# Patient Record
Sex: Male | Born: 1980
Health system: Southern US, Community
[De-identification: ages and names within clinical notes are randomized; demographics above are authoritative.]

## PROBLEM LIST (undated history)

## (undated) DIAGNOSIS — J45909 Unspecified asthma, uncomplicated: Secondary | ICD-10-CM

---

## 2004-11-14 ENCOUNTER — Emergency Department (HOSPITAL_COMMUNITY): Admission: EM | Admit: 2004-11-14 | Discharge: 2004-11-14 | Payer: Self-pay | Admitting: Emergency Medicine

## 2004-11-16 ENCOUNTER — Emergency Department (HOSPITAL_COMMUNITY): Admission: EM | Admit: 2004-11-16 | Discharge: 2004-11-16 | Payer: Self-pay | Admitting: Emergency Medicine

## 2005-03-29 ENCOUNTER — Emergency Department (HOSPITAL_COMMUNITY): Admission: EM | Admit: 2005-03-29 | Discharge: 2005-03-29 | Payer: Self-pay | Admitting: Emergency Medicine

## 2005-04-01 ENCOUNTER — Emergency Department (HOSPITAL_COMMUNITY): Admission: EM | Admit: 2005-04-01 | Discharge: 2005-04-01 | Payer: Self-pay | Admitting: Emergency Medicine

## 2005-06-01 ENCOUNTER — Encounter: Admission: RE | Admit: 2005-06-01 | Discharge: 2005-06-01 | Payer: Self-pay | Admitting: *Deleted

## 2013-04-24 ENCOUNTER — Encounter (HOSPITAL_COMMUNITY): Payer: Self-pay | Admitting: Emergency Medicine

## 2013-04-24 ENCOUNTER — Emergency Department (HOSPITAL_COMMUNITY)
Admission: EM | Admit: 2013-04-24 | Discharge: 2013-04-24 | Disposition: A | Discharge status: Home / Self Care | Payer: BC Managed Care – PPO | Attending: Emergency Medicine | Admitting: Emergency Medicine

## 2013-04-24 DIAGNOSIS — T25229A Burn of second degree of unspecified foot, initial encounter: Secondary | ICD-10-CM | POA: Insufficient documentation

## 2013-04-24 DIAGNOSIS — Y92009 Unspecified place in unspecified non-institutional (private) residence as the place of occurrence of the external cause: Secondary | ICD-10-CM | POA: Insufficient documentation

## 2013-04-24 DIAGNOSIS — Z79899 Other long term (current) drug therapy: Secondary | ICD-10-CM | POA: Insufficient documentation

## 2013-04-24 DIAGNOSIS — J45909 Unspecified asthma, uncomplicated: Secondary | ICD-10-CM | POA: Insufficient documentation

## 2013-04-24 DIAGNOSIS — Y93G1 Activity, food preparation and clean up: Secondary | ICD-10-CM | POA: Insufficient documentation

## 2013-04-24 DIAGNOSIS — Z888 Allergy status to other drugs, medicaments and biological substances status: Secondary | ICD-10-CM | POA: Insufficient documentation

## 2013-04-24 DIAGNOSIS — Z23 Encounter for immunization: Secondary | ICD-10-CM | POA: Insufficient documentation

## 2013-04-24 DIAGNOSIS — X12XXXA Contact with other hot fluids, initial encounter: Secondary | ICD-10-CM | POA: Insufficient documentation

## 2013-04-24 DIAGNOSIS — IMO0002 Reserved for concepts with insufficient information to code with codable children: Secondary | ICD-10-CM

## 2013-04-24 HISTORY — DX: Unspecified asthma, uncomplicated: J45.909

## 2013-04-24 MED ORDER — TETANUS-DIPHTH-ACELL PERTUSSIS 5-2.5-18.5 LF-MCG/0.5 IM SUSP
0.5000 mL | Freq: Once | INTRAMUSCULAR | Status: AC
  Administered 2013-04-24: 0.5 mL via INTRAMUSCULAR
  Filled 2013-04-24: qty 0.5
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
## 2013-04-28 NOTE — ED Provider Notes (Signed)
Medical screening examination/treatment/procedure(s) were performed by non-physician practitioner and as supervising physician I was immediately available for consultation/collaboration.  EKG Interpretation   None        Ethelda Chick, MD 04/28/13 [phone]

## 2013-06-09 ENCOUNTER — Encounter (HOSPITAL_COMMUNITY): Payer: Self-pay | Admitting: Emergency Medicine

## 2013-06-09 ENCOUNTER — Emergency Department (HOSPITAL_COMMUNITY)
Admission: EM | Admit: 2013-06-09 | Discharge: 2013-06-09 | Disposition: A | Discharge status: Home / Self Care | Payer: BC Managed Care – PPO | Attending: Emergency Medicine | Admitting: Emergency Medicine

## 2013-06-09 ENCOUNTER — Emergency Department (HOSPITAL_COMMUNITY): Payer: BC Managed Care – PPO

## 2013-06-09 DIAGNOSIS — Z79899 Other long term (current) drug therapy: Secondary | ICD-10-CM | POA: Insufficient documentation

## 2013-06-09 DIAGNOSIS — J45909 Unspecified asthma, uncomplicated: Secondary | ICD-10-CM | POA: Insufficient documentation

## 2013-06-09 DIAGNOSIS — J111 Influenza due to unidentified influenza virus with other respiratory manifestations: Secondary | ICD-10-CM

## 2013-06-09 MED ORDER — DIPHENHYDRAMINE HCL 50 MG/ML IJ SOLN
25.0000 mg | Freq: Once | INTRAMUSCULAR | Status: AC
  Administered 2013-06-09: 25 mg via INTRAVENOUS
  Filled 2013-06-09: qty 1

## 2013-06-09 MED ORDER — ACETAMINOPHEN 325 MG PO TABS
650.0000 mg | ORAL_TABLET | Freq: Four times a day (QID) | ORAL | Status: DC | PRN
  Administered 2013-06-09: 650 mg via ORAL
  Filled 2013-06-09: qty 2

## 2013-06-09 MED ORDER — SODIUM CHLORIDE 0.9 % IV BOLUS (SEPSIS)
1000.0000 mL | INTRAVENOUS | Status: AC
  Administered 2013-06-09: 1000 mL via INTRAVENOUS

## 2013-06-09 MED ORDER — OSELTAMIVIR PHOSPHATE 75 MG PO CAPS: 75.0000 mg | ORAL_CAPSULE | Freq: Two times a day (BID) | ORAL | Status: DC

## 2013-06-09 MED ORDER — METOCLOPRAMIDE HCL 5 MG/ML IJ SOLN
5.0000 mg | Freq: Once | INTRAMUSCULAR | Status: AC
  Administered 2013-06-09: 5 mg via INTRAVENOUS
  Filled 2013-06-09: qty 2

## 2013-06-09 MED ORDER — DEXAMETHASONE SODIUM PHOSPHATE 10 MG/ML IJ SOLN
10.0000 mg | Freq: Once | INTRAMUSCULAR | Status: AC
  Administered 2013-06-09: 10 mg via INTRAVENOUS
  Filled 2013-06-09: qty 1

## 2013-06-09 MED ORDER — OSELTAMIVIR PHOSPHATE 75 MG PO CAPS
75.0000 mg | ORAL_CAPSULE | Freq: Once | ORAL | Status: AC
  Administered 2013-06-09: 75 mg via ORAL
  Filled 2013-06-09: qty 1

## 2013-06-09 NOTE — ED Notes (Signed)
Patient transported to X-ray 

## 2013-06-09 NOTE — ED Provider Notes (Signed)
CSN: [id number]     Arrival date & time 06/09/13  0813 History   First MD Initiated Contact with Patient 06/09/13 [phone]     Chief Complaint  Patient presents with  . Influenza   (Consider location/radiation/quality/duration/timing/severity/associated sxs/prior Treatment) Patient is a 33 y.o. male presenting with flu symptoms. The history is provided by the patient.  Influenza Presenting symptoms: cough, fatigue, fever, headache and myalgias   Presenting symptoms: no diarrhea, no nausea, no rhinorrhea, no shortness of breath and no vomiting   Cough:    Cough characteristics:  Non-productive   Severity:  Mild   Onset quality:  Gradual   Duration:  1 day   Progression:  Unchanged Fever:    Duration:  1 day   Timing:  Constant   Max temp PTA (F):  102   Temp source:  Oral Headaches:    Severity:  Mild   Onset quality:  Gradual   Duration:  1 day   Timing:  Constant   Progression:  Unchanged   Chronicity:  New   Past Medical History  Diagnosis Date  . Asthma    History reviewed. No pertinent past surgical history. No family history on file. History  Substance Use Topics  . Smoking status: Never Smoker   . Smokeless tobacco: Not on file  . Alcohol Use: No    Review of Systems  Constitutional: Positive for fever and fatigue.  HENT: Negative for drooling and rhinorrhea.   Eyes: Negative for pain.  Respiratory: Positive for cough. Negative for shortness of breath.   Cardiovascular: Negative for chest pain and leg swelling.  Gastrointestinal: Negative for nausea, vomiting, abdominal pain and diarrhea.  Genitourinary: Negative for dysuria and hematuria.  Musculoskeletal: Positive for myalgias. Negative for gait problem and neck pain.  Skin: Negative for color change.  Neurological: Positive for headaches. Negative for numbness.  Hematological: Negative for adenopathy.  Psychiatric/Behavioral: Negative for behavioral problems.  All other systems reviewed and are  negative.    Allergies  Theophyllines  Home Medications   Current Outpatient Rx  Name  Route  Sig  Dispense  Refill  . albuterol (PROVENTIL HFA;VENTOLIN HFA) 108 (90 BASE) MCG/ACT inhaler   Inhalation   Inhale 2 puffs into the lungs every 6 (six) hours as needed for wheezing or shortness of breath.         . Multiple Vitamin (MULTIVITAMIN WITH MINERALS) TABS tablet   Oral   Take 1 tablet by mouth daily.          BP 137/63  Pulse 94  Temp(Src) 102.1 F (38.9 C) (Oral)  Resp 20  SpO2 96% Physical Exam  Nursing note and vitals reviewed. Constitutional: He is oriented to person, place, and time. He appears well-developed and well-nourished.  HENT:  Head: Normocephalic and atraumatic.  Right Ear: External ear normal.  Left Ear: External ear normal.  Nose: Nose normal.  Mouth/Throat: Oropharynx is clear and moist. No oropharyngeal exudate.  Eyes: Conjunctivae and EOM are normal. Pupils are equal, round, and reactive to light.  Neck: Normal range of motion. Neck supple.  Cardiovascular: Normal rate, regular rhythm, normal heart sounds and intact distal pulses.  Exam reveals no gallop and no friction rub.   No murmur heard. Pulmonary/Chest: Effort normal and breath sounds normal. No respiratory distress. He has no wheezes.  Abdominal: Soft. Bowel sounds are normal. He exhibits no distension. There is no tenderness. There is no rebound and no guarding.  Musculoskeletal: Normal range of motion. He exhibits  no edema and no tenderness.  Neurological: He is alert and oriented to person, place, and time.  Skin: Skin is warm and dry.  Psychiatric: He has a normal mood and affect. His behavior is normal.    ED Course  Procedures (including critical care time) Labs Review Labs Reviewed - No data to display Imaging Review Dg Chest 2 View  06/09/2013   CLINICAL DATA:  Fever  EXAM: CHEST  2 VIEW  COMPARISON:  November 14, 2004  FINDINGS: Lungs are clear. Heart size and pulmonary  vascularity are normal. No adenopathy. No bone lesions.  IMPRESSION: No abnormality noted.   Electronically Signed   By: Bretta BangWilliam  Woodruff M.D.   On: 06/09/2013 09:10    EKG Interpretation   None       MDM   1. Influenza    8:39 AM [age] y.o. male who presents with headache, body aches, fever, chills which began yesterday. He is febrile here but vital signs otherwise unremarkable. I suspect he has a viral syndrome. Will treat headache with a migraine cocktail and get a screening chest x-ray. Suspect viral syndrome.   Pt care transferred to Dr. Rosalia Hammersay.     Junius ArgyleForrest S Madalyne Husk, MD 06/09/13 [phone]

## 2013-06-09 NOTE — ED Provider Notes (Signed)
33 year old male history of asthma presents today with cough and fever. Originally seen by Dr. Romeo AppleHarrison and checked out to me. Patient appears hemodynamically stable. Chest x-[facility] was obtained and there is no evidence of acute infiltrate.  Discussed results with patient and family.  Discussed cost /benefit ratio tamiflu and patient wishes to proceed with treatment.  Plan work note for three days or until afebrile for 24 hours.  Return precautions discussed and patient will be given referral info and advised can recheck ucc or here if worse.    Hilario Quarryanielle S Quetzaly Ebner, MD 06/09/13 [phone]

## 2013-06-09 NOTE — Discharge Instructions (Signed)
Influenza, Adult °Influenza (flu) is an infection in the mouth, nose, and throat (respiratory tract) caused by a virus. The flu can make you feel very ill. Influenza spreads easily from person to person (contagious).  °HOME CARE  °· Only take medicines as told by your doctor. °· Use a cool mist humidifier to make breathing easier. °· Get plenty of rest until your fever goes away. This usually takes 3 to 4 days. °· Drink enough fluids to keep your pee (urine) clear or pale yellow. °· Cover your mouth and nose when you cough or sneeze. °· Wash your hands well to avoid spreading the flu. °· Stay home from work or school until your fever has been gone for at least 1 full day. °· Get a flu shot every year. °GET HELP RIGHT AWAY IF:  °· You have trouble breathing or feel short of breath. °· Your skin or nails turn blue. °· You have severe neck pain or stiffness. °· You have a severe headache, facial pain, or earache. °· Your fever gets worse or keeps coming back. °· You feel sick to your stomach (nauseous), throw up (vomit), or have watery poop (diarrhea). °· You have chest pain. °· You have a deep cough that gets worse, or you cough up more thick spit (mucus). °MAKE SURE YOU:  °· Understand these instructions. °· Will watch your condition. °· Will get help right away if you are not doing well or get worse. °Document Released: 02/12/2008 Document Revised: 11/04/2011 Document Reviewed: 08/04/2011 °ExitCare® Patient Information ©2014 [employer]. ° ° °Emergency Department Resource Guide °1) Find a Doctor and Pay Out of Pocket °Although you won't have to find out who is covered by your insurance plan, it is a good idea to ask around and get recommendations. You will then need to call the office and see if the doctor you have chosen will accept you as a new patient and what types of options they offer for patients who are self-pay. Some doctors offer discounts or will set up payment plans for their patients who do not have  insurance, but you will need to ask so you aren't surprised when you get to your appointment. ° °2) Contact Your Local Health Department °Not all health departments have doctors that can see patients for sick visits, but many do, so it is worth a call to see if yours does. If you don't know where your local health department is, you can check in your phone book. The CDC also has a tool to help you locate your state's health department, and many state websites also have listings of all of their local health departments. ° °3) Find a Walk-in Clinic °If your illness is not likely to be very severe or complicated, you may want to try a walk in clinic. These are popping up all over the country in pharmacies, drugstores, and shopping centers. They're usually staffed by nurse practitioners or physician assistants that have been trained to treat common illnesses and complaints. They're usually fairly quick and inexpensive. However, if you have serious medical issues or chronic medical problems, these are probably not your best option. ° °No Primary Care Doctor: °- Call [facility] at  832-8000 - they can help you locate a primary care doctor that  accepts your insurance, provides certain services, etc. °- Physician Referral Service- 1-800-533-3463 ° °Chronic Pain Problems: °Organization         Address  Phone   Notes  °[facility]  (  336) 297-2271 Patients need to be referred by their primary care doctor.  ° °Medication Assistance: °Organization         Address  Phone   Notes  °[place] [facility] [address] °[place], [place] [address] (336) 641-8030 --Must be a resident of [place] °-- Must have NO insurance coverage whatsoever (no Medicaid/ Medicare, etc.) °-- The pt. MUST have a primary care doctor that directs their care regularly and follows them in the community °  °[facility]  (866) 331-1348   °[employer]  (888) 892-1162   ° °Agencies that provide  inexpensive medical care: °Organization         Address  Phone   Notes  °[facility]  (336) 832-8035   °[facility]    (336) 832-7272   °[facility] [address] °[place], [place] [address] (336) 832-4777   °[facility] [address], °[place] (336) 271-4999   °[facility]    (336) 373-0678   °[facility]    (336) 272-1050   °[facility] ° [address], [place] Phone:  (336) 832-4444, Fax:  (336) 832-4440 Hours of Operation:  9 am - 6 pm, M-F.  Also accepts Medicaid/Medicare and self-pay.  °[facility] ° [address], [place] Phone: (336) 832-3150, Fax: (336) 832-3151. Hours of Operation:  8:30 am - 5:30 pm, M-F.  Also accepts Medicaid and self-pay.  °[facility] [place] [address], [place] Phone: (336) 878-6027   °[facility] [address], [place], [place] (336)723-1848, Ext. 123 Mondays & Thursdays: 7-9 AM.  First 15 patients are seen on a first come, first serve basis. °  ° °Medicaid-accepting [place] Providers: ° °Organization         Address  Phone   Notes  °[facility] [address], [place] (336) 641-2100 Also accepts self-pay patients.  °[facility] [address], [place] ° (336) 856-9996   °[facility] [address], [place] (336) 288-8857   °[facility] [address] [place] [address], [place] (336) 299-7000   °Veita Bland [address], [place]  ° (336) 373-1557 Only accepts [place] Access Medicaid patients after they have their name applied to their card.  ° °Self-Pay (no insurance) in [place]: ° °Organization         Address  Phone   Notes  °Sickle Cell Patients, [facility] [address], [place] (336) 832-1970   °[facility] [address], [place] (336) 832-4400   °[facility] ° [address], [place] (336) 992-4800   °[facility]/Dr. Osei-Bonsu ° [address] [place] [address], [place] or [address], [place] (336) 841-8500 Phone number for both [place] and [place] locations is the same.  °[facility] [address], [place] (336) 299-0000   °[facility] [address] [place] [address], [place] or [address] (336) 852-7530 °(336) 878-2260   °[facility] [address],  (336) 350-1642, phone; (336) 294-5005, fax Sees patients 1st and 3rd Saturday of every month.  Must not qualify for public or private insurance (i.e. Medicaid, Medicare, [place] Health Choice, Veterans' Benefits) •   Household income should be no more than 200% of the poverty level •The clinic cannot treat you if you are pregnant or think you are pregnant • Sexually transmitted diseases are not treated at the clinic.  ° ° °Dental Care: °Organization         Address  Phone  Notes  °[facility] [address], [place] (336) 641-6152 Accepts children up to age 21 who are enrolled in Medicaid or [place] Health Choice; pregnant women with a Medicaid card; and children who have applied for Medicaid or [place] Health Choice, but were declined, whose parents can pay a reduced fee at time of service.  °[facility] [place]  [address], [place] (336) 641-7733 Accepts children up to age 21 who are enrolled in Medicaid or [place] Health Choice; pregnant women with a Medicaid card; and children who have applied for Medicaid or [place] Health Choice, but were declined, whose parents can pay a reduced fee at time of service.  °[facility] ° [address], [place] (336) 641-4533 Patients are seen by appointment only. Walk-ins are  not accepted. [facility] will see patients 18 years of age and older. °Monday - Tuesday (8am-5pm) °Most Wednesdays (8:30-5pm) °$30 per visit, cash only  °[facility] ° [address], [place] (336) 641-4533 Patients are seen by appointment only. Walk-ins are not accepted. [facility] will see patients 18 years of age and older. °One Wednesday Evening (Monthly: Volunteer Based).  $30 per visit, cash only  °[facility]  (919) 537-3737 for adults; Children under age 4, call Graduate Pediatric Dentistry at (919) [phone]. Children aged 4-14, please call (919) [phone] to request a pediatric application. ° Dental services are provided in all areas of dental care including fillings, crowns and bridges, complete and partial dentures, implants, gum treatment, root canals, and extractions. Preventive care is also provided. Treatment is provided to both adults and children. °Patients are selected via a lottery and there is often a waiting list. °  °[facility] [address], °[place] ° (336) 763-8833 [email or web address] °  °[facility] [address], [place], [place] (336)723-1848, Ext. 123 Second and Fourth Thursday of each month, opens at 6:30 AM; Clinic ends at 9 AM.  Patients are seen on a first-come first-served basis, and a limited number are seen during each clinic.  ° °[facility] ° [address], [place], [place] (336) 723-7904   Eligibility Requirements °You must have lived in [place], [place], or [place] counties for at least the last three months. °  You cannot be eligible for state or federal sponsored healthcare insurance, including Veterans Administration, Medicaid, or Medicare. °  You generally cannot be eligible for healthcare insurance through your employer.  °  How to apply: °Eligibility screenings are held every Tuesday and Wednesday afternoon from 1:00 pm until 4:00 pm. You do not need an appointment for  the interview!  °[facility] [address], [place],  336-631-2330   °[facility]  336-342-8273   °[facility]  336-703-3100   °[facility]  336-570-6415   ° °Behavioral Health Resources in the Community: °Intensive Outpatient Programs °Organization         Address  Phone  Notes  °[facility] [address], [place],   [place] 336-878-6098   °[facility] [address], [place], [place] 336-832-9800   °[facility] [address], [place], [place] ° 336-882-2125   °[facility] [address],  °[place], [place] 1-800-853-5163 or 336-641-4981   °Substance Abuse Resources °Organization         Address  Phone  Notes  °[facility]  336-882-2125   °[facility]  336-784-9470   °[facility]  336-285-9073   °[facility]  336-845-3988   °Residential & Outpatient Substance Abuse Program  1-800-659-3381   °Psychological Services °Organization         Address  Phone  Notes  °[facility]  336- 832-9600   °[facility]  336- 378-7881   °[facility] [address], [place] 1-800-853-5163 or 336-641-4981   ° °Mobile Crisis Teams °Organization         Address  Phone  Notes  °[facility], Mobile Crisis Care Unit  1-877-626-1772   °Assertive °[facility] ° [address] [place], [place] 336-834-9664   °Sharon DeEsch [address] °[place] 336-554-5454   ° °Self-Help/Support Groups °Organization         Address  Phone             Notes  °[facility] [place] [facility] - variety of support groups  336- 373-1402 Call for more information  °Narcotics Anonymous (NA), [facility] [address], °[place]  2 meetings at this location  ° °Residential Treatment Programs °Organization         Address  Phone  Notes  °[facility]  [address],    °[place]  1-866-801-8205   °[facility] ° [address], [place], [place] 704-293-8524   °[facility] [address], [place] 336-845-3988 Admissions: 8am-3pm M-F  °[facility] [address]    °[place], [place] 336-841-1104   °[facility] [address], [place], [place] 336-379-7146   °[facility] [address]  °[place], [place] 336-285-9073   °[facility] - Intensive Outpatient [address], , [place] 336-852-3033   °[facility] [address]  °[place], [place] 1-877-615-2722 or 336-784-9470   °[facility] [address] [place], [place] 336-227-7417 Accepts Medicaid  °[facility] [address]  ° [place] 1-800-659-3381 Substance Abuse/Addiction Treatment  ° °[place] Behavioral Health Resources °Organization         Address  Phone  Notes  °[facility]  (888) 581-9988   °Julie Brannon, PhD [address] [place], [place]   (336) 349-5553 or (336) 951-0000   °[facility]   [address] °[place], [place] (336) 349-4454   °[facility] [address], [place], [place] (336) 342-8316 Insurance/Medicaid/sponsorship through [facility]  °[facility] [address]                                    [place],  (336) 342-8316 Therapy/tele-psych/case  °[facility] [address]  ° [place],  (336) 349-2233    °Dr. Arfeen  (336) 349-4544   °[facility] [place]  [employer] [place] [facility] 1) [address], [place] °2) [address], [place] °3)  [address], [place] (336) 349-3220 °(336) 342-7768 ° °(336) 342-8140   °[place] [facility] (336) 342-1394 or (336) 342-3537 (After Hours)    ° ° ° °

## 2013-06-09 NOTE — ED Notes (Signed)
MD at bedside. 

## 2013-06-09 NOTE — ED Notes (Signed)
Pt reports headache, body aches, SOB, tiredness, and chills. Pt denies taking any OTC medications today.

## 2013-08-05 ENCOUNTER — Encounter (HOSPITAL_COMMUNITY): Payer: Self-pay | Admitting: Emergency Medicine

## 2013-08-05 ENCOUNTER — Emergency Department (HOSPITAL_COMMUNITY)
Admission: EM | Admit: 2013-08-05 | Discharge: 2013-08-06 | Disposition: A | Discharge status: Home / Self Care | Payer: Self-pay | Attending: Emergency Medicine | Admitting: Emergency Medicine

## 2013-08-05 DIAGNOSIS — L0390 Cellulitis, unspecified: Secondary | ICD-10-CM

## 2013-08-05 DIAGNOSIS — L02419 Cutaneous abscess of limb, unspecified: Secondary | ICD-10-CM | POA: Insufficient documentation

## 2013-08-05 DIAGNOSIS — J45909 Unspecified asthma, uncomplicated: Secondary | ICD-10-CM | POA: Insufficient documentation

## 2013-08-05 DIAGNOSIS — L03119 Cellulitis of unspecified part of limb: Principal | ICD-10-CM

## 2013-08-05 DIAGNOSIS — Z79899 Other long term (current) drug therapy: Secondary | ICD-10-CM | POA: Insufficient documentation

## 2013-08-05 LAB — CBC WITH DIFFERENTIAL/PLATELET
Basophils Absolute: 0.1 10*3/uL (ref 0.0–0.1)
Basophils Relative: 1 % (ref 0–1)
Eosinophils Absolute: 0.2 10*3/uL (ref 0.0–0.7)
Eosinophils Relative: 2 % (ref 0–5)
HEMATOCRIT: 38 % — AB (ref 39.0–52.0)
Hemoglobin: 13.9 g/dL (ref 13.0–17.0)
LYMPHS PCT: 33 % (ref 12–46)
Lymphs Abs: 3.6 10*3/uL (ref 0.7–4.0)
MCH: 30.4 pg (ref 26.0–34.0)
MCHC: 36.6 g/dL — ABNORMAL HIGH (ref 30.0–36.0)
MCV: 83.2 fL (ref 78.0–100.0)
MONO ABS: 1.6 10*3/uL — AB (ref 0.1–1.0)
Monocytes Relative: 15 % — ABNORMAL HIGH (ref 3–12)
NEUTROS PCT: 51 % (ref 43–77)
Neutro Abs: 5.5 10*3/uL (ref 1.7–7.7)
Platelets: 175 10*3/uL (ref 150–400)
RBC: 4.57 MIL/uL (ref 4.22–5.81)
RDW: 13.8 % (ref 11.5–15.5)
WBC: 10.9 10*3/uL — ABNORMAL HIGH (ref 4.0–10.5)

## 2013-08-05 LAB — I-STAT CG4 LACTIC ACID, ED: Lactic Acid, Venous: 1.02 mmol/L (ref 0.5–2.2)

## 2013-08-05 MED ORDER — KETOROLAC TROMETHAMINE 30 MG/ML IJ SOLN
30.0000 mg | Freq: Once | INTRAMUSCULAR | Status: AC
  Administered 2013-08-05: 30 mg via INTRAVENOUS
  Filled 2013-08-05: qty 1

## 2013-08-05 MED ORDER — CLINDAMYCIN PHOSPHATE 600 MG/50ML IV SOLN
600.0000 mg | Freq: Once | INTRAVENOUS | Status: AC
  Administered 2013-08-05: 600 mg via INTRAVENOUS
  Filled 2013-08-05: qty 50

## 2013-08-05 MED ORDER — SODIUM CHLORIDE 0.9 % IV BOLUS (SEPSIS)
1000.0000 mL | Freq: Once | INTRAVENOUS | Status: AC
  Administered 2013-08-05: 1000 mL via INTRAVENOUS

## 2013-08-05 NOTE — ED Notes (Signed)
The pt has a red swollen lesion to the lt lower leg for 2 days.  He thinks it was a spider bite but he did not see a spider

## 2013-08-05 NOTE — ED Provider Notes (Signed)
CSN: [id number]     Arrival date & time 08/05/13  2202 History  This chart was scribed for non-physician practitioner, Junious Silk, PA-C,working with Shon Baton, MD, by Karle Plumber, ED Scribe.  This patient was seen in room TR11C/TR11C and the patient's care was started at 10:47 PM.  Chief Complaint  Patient presents with  . Insect Bite   The history is provided by the patient. No language interpreter was used.   HPI Comments:  Luis Harvey is a 33 y.o. male with history of asthma who presents to the Emergency Department complaining of a worsening area of swelling, itching, and redness on his right shin that started two days ago. He states the pain started as a pinch and he believes he may have been bitten by a bug, although he did not see one. He reports the pain as throbbing and is a 8/10. He used an OTC spray with aloe in it yesterday with little relief of his symptoms. He denies nausea, vomiting, fever, chills, or abdominal pain.   Past Medical History  Diagnosis Date  . Asthma    History reviewed. No pertinent past surgical history. No family history on file. History  Substance Use Topics  . Smoking status: Never Smoker   . Smokeless tobacco: Not on file  . Alcohol Use: No    Review of Systems  Constitutional: Negative for fever.  Gastrointestinal: Negative for nausea, vomiting and abdominal pain.  Skin: Positive for color change.  All other systems reviewed and are negative.    Allergies  Theophyllines  Home Medications   Current Outpatient Rx  Name  Route  Sig  Dispense  Refill  . albuterol (PROVENTIL HFA;VENTOLIN HFA) 108 (90 BASE) MCG/ACT inhaler   Inhalation   Inhale 2 puffs into the lungs every 6 (six) hours as needed for wheezing or shortness of breath.         . Multiple Vitamin (MULTIVITAMIN WITH MINERALS) TABS tablet   Oral   Take 1 tablet by mouth daily.         Marland Kitchen oseltamivir (TAMIFLU) 75 MG capsule   Oral   Take 1 capsule (75  mg total) by mouth every 12 (twelve) hours.   10 capsule   0    Triage Vitals: BP 140/77  Pulse 82  Temp(Src) 99.3 F (37.4 C) (Oral)  Resp 18  Ht 5\' 11"  (1.803 m)  Wt 199 lb 4 oz (90.379 kg)  BMI 27.80 kg/m2  SpO2 96% Physical Exam  Nursing note and vitals reviewed. Constitutional: He is oriented to person, place, and time. He appears well-developed and well-nourished. No distress.  HENT:  Head: Normocephalic and atraumatic.  Right Ear: External ear normal.  Left Ear: External ear normal.  Nose: Nose normal.  Eyes: Conjunctivae are normal.  Neck: Normal range of motion. No tracheal deviation present.  Cardiovascular: Normal rate, regular rhythm, normal heart sounds, intact distal pulses and normal pulses.   Pulses:      Posterior tibial pulses are 2+ on the right side, and 2+ on the left side.  Pulmonary/Chest: Effort normal and breath sounds normal. No stridor.  Abdominal: Soft. He exhibits no distension. There is no tenderness.  Musculoskeletal: Normal range of motion.  Neurological: He is alert and oriented to person, place, and time.  Skin: Skin is warm and dry. He is not diaphoretic. There is erythema.  4 cm central area of induration without fluctuance with surrounding erythema and warmth. Left leg swollen in comparison  to right.   Psychiatric: He has a normal mood and affect. His behavior is normal.    ED Course  Procedures (including critical care time) DIAGNOSTIC STUDIES: Oxygen Saturation is 96% on RA, adequate by my interpretation.   COORDINATION OF CARE: 10:51 PM- Will order lab work and administer IV antibiotics. Pt verbalizes understanding and agrees to plan.  Medications  clindamycin (CLEOCIN) IVPB 600 mg (0 mg Intravenous Stopped 08/06/13 0112)  ketorolac (TORADOL) 30 MG/ML injection 30 mg (30 mg Intravenous Given 08/05/13 2317)  sodium chloride 0.9 % bolus 1,000 mL (0 mLs Intravenous Stopped 08/06/13 0044)  gi cocktail (Maalox,Lidocaine,Donnatal) (30 mLs  Oral Given 08/06/13 0047)    Labs Review Labs Reviewed  CBC WITH DIFFERENTIAL - Abnormal; Notable for the following:    WBC 10.9 (*)    HCT 38.0 (*)    MCHC 36.6 (*)    Monocytes Relative 15 (*)    Monocytes Absolute 1.6 (*)    All other components within normal limits  COMPREHENSIVE METABOLIC PANEL - Abnormal; Notable for the following:    GFR calc non Af Amer 87 (*)    All other components within normal limits  CULTURE, BLOOD (ROUTINE X 2)  CULTURE, BLOOD (ROUTINE X 2)  I-STAT CG4 LACTIC ACID, ED   Imaging Review No results found.   EKG Interpretation None      MDM   Final diagnoses:  Cellulitis    Patient presents to ED with cellulitis. He is well appearing, non toxic. Labs were done which shows elevated WBC at 10.9. IV clindamycin done in ED. Area of erythema was circled with a skin marker. Patient is hemodynamically stable and appropriate for discharge with PO abx. Gave strict return instructions. Patient to follow up with PCP for recheck on Monday. Discussed case with Dr. Wilkie AyeHorton. Vital signs stable for discharge. Patient / Family / Caregiver informed of clinical course, understand medical decision-making process, and agree with plan.    I personally performed the services described in this documentation, which was scribed in my presence. The recorded information has been reviewed and is accurate.    Mora BellmanHannah S Raaga Maeder, PA-C 08/06/13 [phone]

## 2013-08-06 LAB — COMPREHENSIVE METABOLIC PANEL
ALBUMIN: 3.8 g/dL (ref 3.5–5.2)
ALK PHOS: 55 U/L (ref 39–117)
ALT: 13 U/L (ref 0–53)
AST: 29 U/L (ref 0–37)
BUN: 12 mg/dL (ref 6–23)
CO2: 28 mEq/L (ref 19–32)
Calcium: 9.2 mg/dL (ref 8.4–10.5)
Chloride: 103 mEq/L (ref 96–112)
Creatinine, Ser: 1.09 mg/dL (ref 0.50–1.35)
GFR calc Af Amer: >90 mL/min (ref >90)
GFR calc non Af Amer: 87 mL/min — ABNORMAL LOW (ref >90)
Glucose, Bld: 91 mg/dL (ref 70–99)
Potassium: 3.8 mEq/L (ref 3.7–5.3)
Sodium: 143 mEq/L (ref 137–147)
Total Bilirubin: 0.3 mg/dL (ref 0.3–1.2)
Total Protein: 7.4 g/dL (ref 6.0–8.3)

## 2013-08-06 MED ORDER — HYDROCODONE-ACETAMINOPHEN 5-325 MG PO TABS: 1.0000 | ORAL_TABLET | Freq: Four times a day (QID) | ORAL | Status: DC | PRN

## 2013-08-06 MED ORDER — CLINDAMYCIN HCL 150 MG PO CAPS: 300.0000 mg | ORAL_CAPSULE | Freq: Three times a day (TID) | ORAL | Status: DC

## 2013-08-06 MED ORDER — GI COCKTAIL ~~LOC~~
30.0000 mL | Freq: Once | ORAL | Status: AC
  Administered 2013-08-06: 30 mL via ORAL
  Filled 2013-08-06: qty 30

## 2013-08-06 NOTE — ED Provider Notes (Signed)
Medical screening examination/treatment/procedure(s) were performed by non-physician practitioner and as supervising physician I was immediately available for consultation/collaboration.   EKG Interpretation None        Shon Batonourtney F Nuno Brubacher, MD 08/06/13 1456

## 2013-08-06 NOTE — Discharge Instructions (Signed)
Cellulitis °Cellulitis is an infection of the skin and the tissue under the skin. The infected area is usually red and tender. This happens most often in the arms and lower legs. °HOME CARE  °· Take your antibiotic medicine as told. Finish the medicine even if you start to feel better. °· Keep the infected arm or leg raised (elevated). °· Put a warm cloth on the area up to 4 times per day. °· Only take medicines as told by your doctor. °· Keep all doctor visits as told. °GET HELP RIGHT AWAY IF:  °· You have a fever. °· You feel very sleepy. °· You throw up (vomit) or have watery poop (diarrhea). °· You feel sick and have muscle aches and pains. °· You see red streaks on the skin coming from the infected area. °· Your red area gets bigger or turns a dark color. °· Your bone or joint under the infected area is painful after the skin heals. °· Your infection comes back in the same area or different area. °· You have a puffy (swollen) bump in the infected area. °· You have new symptoms. °MAKE SURE YOU:  °· Understand these instructions. °· Will watch your condition. °· Will get help right away if you are not doing well or get worse. °Document Released: 10/22/2007 Document Revised: 11/04/2011 Document Reviewed: 07/21/2011 °ExitCare® Patient Information ©2014 [employer]. ° °

## 2013-08-12 LAB — CULTURE, BLOOD (ROUTINE X 2)
CULTURE: NO GROWTH
Culture: NO GROWTH

## 2013-11-22 ENCOUNTER — Encounter (HOSPITAL_COMMUNITY): Payer: Self-pay | Admitting: Emergency Medicine

## 2013-11-22 ENCOUNTER — Emergency Department (HOSPITAL_COMMUNITY)
Admission: EM | Admit: 2013-11-22 | Discharge: 2013-11-22 | Disposition: A | Discharge status: Home / Self Care | Payer: Self-pay | Attending: Emergency Medicine | Admitting: Emergency Medicine

## 2013-11-22 DIAGNOSIS — J029 Acute pharyngitis, unspecified: Secondary | ICD-10-CM | POA: Insufficient documentation

## 2013-11-22 DIAGNOSIS — J45909 Unspecified asthma, uncomplicated: Secondary | ICD-10-CM | POA: Insufficient documentation

## 2013-11-22 DIAGNOSIS — Z792 Long term (current) use of antibiotics: Secondary | ICD-10-CM | POA: Insufficient documentation

## 2013-11-22 LAB — RAPID STREP SCREEN (MED CTR MEBANE ONLY): Streptococcus, Group A Screen (Direct): NEGATIVE

## 2013-11-22 MED ORDER — IBUPROFEN 800 MG PO TABS
800.0000 mg | ORAL_TABLET | Freq: Once | ORAL | Status: AC
  Administered 2013-11-22: 800 mg via ORAL
  Filled 2013-11-22: qty 1

## 2013-11-22 NOTE — ED Provider Notes (Signed)
Medical screening examination/treatment/procedure(s) were performed by non-physician practitioner and as supervising physician I was immediately available for consultation/collaboration.    Linwood DibblesJon Marcha Licklider, MD 11/22/13 2308

## 2013-11-22 NOTE — Discharge Instructions (Signed)
Sore Throat °A sore throat is pain, burning, irritation, or scratchiness of the throat. There is often pain or tenderness when swallowing or talking. A sore throat may be accompanied by other symptoms, such as coughing, sneezing, fever, and swollen neck glands. A sore throat is often the first sign of another sickness, such as a cold, flu, strep throat, or mononucleosis (commonly known as mono). Most sore throats go away without medical treatment. °CAUSES  °The most common causes of a sore throat include: °· A viral infection, such as a cold, flu, or mono. °· A bacterial infection, such as strep throat, tonsillitis, or whooping cough. °· Seasonal allergies. °· Dryness in the air. °· Irritants, such as smoke or pollution. °· Gastroesophageal reflux disease (GERD). °HOME CARE INSTRUCTIONS  °· Only take over-the-counter medicines as directed by your caregiver. °· Drink enough fluids to keep your urine clear or pale yellow. °· Rest as needed. °· Try using throat sprays, lozenges, or sucking on hard candy to ease any pain (if older than 4 years or as directed). °· Sip warm liquids, such as broth, herbal tea, or warm water with honey to relieve pain temporarily. You may also eat or drink cold or frozen liquids such as frozen ice pops. °· Gargle with salt water (mix 1 tsp salt with 8 oz of water). °· Do not smoke and avoid secondhand smoke. °· Put a cool-mist humidifier in your bedroom at night to moisten the air. You can also turn on a hot shower and sit in the bathroom with the door closed for 5-10 minutes. °SEEK IMMEDIATE MEDICAL CARE IF: °· You have difficulty breathing. °· You are unable to swallow fluids, soft foods, or your saliva. °· You have increased swelling in the throat. °· Your sore throat does not get better in 7 days. °· You have nausea and vomiting. °· You have a fever or persistent symptoms for more than 2-3 days. °· You have a fever and your symptoms suddenly get worse. °MAKE SURE YOU:  °· Understand  these instructions. °· Will watch your condition. °· Will get help right away if you are not doing well or get worse. °Document Released: 06/12/2004 Document Revised: 04/21/2012 Document Reviewed: 01/11/2012 °ExitCare® Patient Information ©2015 [employer]. This information is not intended to replace advice given to you by your health care provider. Make sure you discuss any questions you have with your health care provider. ° °Salt Water Gargle °This solution will help make your mouth and throat feel better. °HOME CARE INSTRUCTIONS  °· Mix 1 teaspoon of salt in 8 ounces of warm water. °· Gargle with this solution as much or often as you need or as directed. Swish and gargle gently if you have any sores or wounds in your mouth. °· Do not swallow this mixture. °Document Released: 02/07/2004 Document Revised: 07/28/2011 Document Reviewed: 06/30/2008 °ExitCare® Patient Information ©2015 [employer]. This information is not intended to replace advice given to you by your health care provider. Make sure you discuss any questions you have with your health care provider. ° °

## 2013-11-22 NOTE — ED Notes (Signed)
Pt reports sore throat x 5 days. Denies fever. Coughing up thick mucus. Using OTC meds. Pt alert, oriented and appropriate

## 2013-11-22 NOTE — Progress Notes (Signed)
[facility] provided pt with a list of primary care resources and a [facility] Orange Card application to help patient establish primary care.  °

## 2013-11-22 NOTE — ED Provider Notes (Signed)
CSN: [id number]     Arrival date & time 11/22/13  1441 History  This chart was Luis Harvey for non-physician practitioner Junious SilkHannah Shanica Castellanos, working with Linwood DibblesJon Knapp, MD by Carl Bestelina Holson, ED Scribe. This patient was seen in room WTR5/WTR5 and the patient's care was started at 3:23 PM.    Chief Complaint  Patient presents with  . Sore Throat    5 day hx of sore throat   Patient is a 33 y.o. male presenting with pharyngitis. The history is provided by the patient. No language interpreter was used.  Sore Throat Pertinent negatives include no abdominal pain.   HPI Comments: Luis Harvey is a 33 y.o. male who presents to the Emergency Department complaining of constant sore throat that started four days ago.  The patient denies cough, nausea, vomiting, abdominal pain, congestion, and ear pain as associated symptoms.  He states that he has used Chloraseptic spray, Benadryl, and cough drops with no relief to his symptoms.  The patient states that his ears will pop intermittently.   Past Medical History  Diagnosis Date  . Asthma    History reviewed. No pertinent past surgical history. History reviewed. No pertinent family history. History  Substance Use Topics  . Smoking status: Never Smoker   . Smokeless tobacco: Not on file  . Alcohol Use: No    Review of Systems  Constitutional: Negative for fever.  HENT: Positive for sore throat. Negative for congestion and ear pain.   Respiratory: Negative for cough.   Gastrointestinal: Negative for nausea, vomiting and abdominal pain.  All other systems reviewed and are negative.     Allergies  Theophyllines  Home Medications   Prior to Admission medications   Medication Sig Start Date End Date Taking? Authorizing Provider  clindamycin (CLEOCIN) 150 MG capsule Take 2 capsules (300 mg total) by mouth 3 (three) times daily. May dispense as 150mg  capsules 08/06/13   Mora BellmanHannah S Jeris Roser, PA-C  HYDROcodone-acetaminophen (NORCO/VICODIN) 5-325 MG per  tablet Take 1-2 tablets by mouth every 6 (six) hours as needed. 08/06/13   Mora BellmanHannah S Shem Plemmons, PA-C  OVER THE COUNTER MEDICATION Apply 1 application topically daily as needed ("pain spray" for leg pain).     Historical Provider, MD   Triage Vitals: BP 123/78  Pulse 77  Temp(Src) 98.4 F (36.9 C) (Oral)  Resp 18  SpO2 100%  Physical Exam  Nursing note and vitals reviewed. Constitutional: He is oriented to person, place, and time. He appears well-developed and well-nourished. No distress.  HENT:  Head: Normocephalic and atraumatic.  Right Ear: Hearing, external ear and ear canal normal. Tympanic membrane is retracted.  Left Ear: Hearing, external ear and ear canal normal. Tympanic membrane is retracted.  Nose: Nose normal.  Mouth/Throat: Uvula is midline. No trismus in the jaw. No uvula swelling. Posterior oropharyngeal erythema (mild) present. No oropharyngeal exudate, posterior oropharyngeal edema or tonsillar abscesses.  Eyes: Conjunctivae and EOM are normal.  Neck: Normal range of motion. No tracheal deviation present.  Cardiovascular: Normal rate, regular rhythm and normal heart sounds.   Pulmonary/Chest: Effort normal and breath sounds normal. No stridor.  Abdominal: Soft. He exhibits no distension. There is no tenderness.  Musculoskeletal: Normal range of motion.  Neurological: He is alert and oriented to person, place, and time.  Skin: Skin is warm and dry. He is not diaphoretic.  Psychiatric: He has a normal mood and affect. His behavior is normal.    ED Course  Procedures (including critical care time)  DIAGNOSTIC STUDIES:  Oxygen Saturation is 100% on room air, normal by my interpretation.    COORDINATION OF CARE: 3:25 PM- Discussed a clinical suspicion of a viral infection and advised the patient to take Advil and Tylenol to treat his pain.  The patient agreed to the treatment plan.     Labs Review Labs Reviewed  RAPID STREP SCREEN  CULTURE, GROUP A STREP     Imaging Review No results found.   EKG Interpretation None      MDM   Final diagnoses:  Viral pharyngitis    Pt afebrile without tonsillar exudate, negative strep. Presents with mild cervical lymphadenopathy, & dysphagia; diagnosis of viral pharyngitis. No abx indicated. DC w symptomatic tx for pain  Pt does not appear dehydrated, but did discuss importance of water rehydration. Presentation non concerning for PTA or infxn spread to soft tissue. No trismus or uvula deviation. Specific return precautions discussed. Pt able to drink water in ED without difficulty with intact air way. Recommended PCP follow up.   I personally performed the services described in this documentation, which was scribed in my presence. The recorded information has been reviewed and is accurate.    Mora BellmanHannah S Aliz Meritt, PA-C 11/22/13 [phone]

## 2013-11-23 ENCOUNTER — Emergency Department (HOSPITAL_COMMUNITY)
Admission: EM | Admit: 2013-11-23 | Discharge: 2013-11-23 | Disposition: A | Discharge status: Home / Self Care | Payer: Self-pay | Attending: Emergency Medicine | Admitting: Emergency Medicine

## 2013-11-23 ENCOUNTER — Encounter (HOSPITAL_COMMUNITY): Payer: Self-pay | Admitting: Emergency Medicine

## 2013-11-23 DIAGNOSIS — K122 Cellulitis and abscess of mouth: Secondary | ICD-10-CM | POA: Insufficient documentation

## 2013-11-23 DIAGNOSIS — Z79899 Other long term (current) drug therapy: Secondary | ICD-10-CM | POA: Insufficient documentation

## 2013-11-23 DIAGNOSIS — J45909 Unspecified asthma, uncomplicated: Secondary | ICD-10-CM | POA: Insufficient documentation

## 2013-11-23 MED ORDER — AMOXICILLIN 500 MG PO CAPS: 1000.0000 mg | ORAL_CAPSULE | Freq: Two times a day (BID) | ORAL | Status: DC

## 2013-11-23 MED ORDER — DEXAMETHASONE 6 MG PO TABS
10.0000 mg | ORAL_TABLET | Freq: Once | ORAL | Status: AC
  Administered 2013-11-23: 10 mg via ORAL
  Filled 2013-11-23: qty 1

## 2013-11-23 NOTE — Discharge Instructions (Signed)
Uvulitis  Uvulitis is redness and soreness (inflammation) of the uvula. The uvula is the small tongue-shaped piece of tissue in the back of your mouth.   CAUSES  Infection is a common cause of uvulitis. Infection of the uvula can be either viral or bacterial. Infectious uvulitis usually only occurs in association with another condition, such as inflammation and infection of the mouth or throat.   Other causes of uvulitis include:  · Trauma to the uvula.  · Swelling from excess fluid buildup (edema), which may be an allergic reaction.  · Inhalation of irritants, such as chemical agents, smoke, or steam.  DIAGNOSIS  Your caregiver can usually diagnose uvulitis through a physical examination. Bacterial uvulitis can be diagnosed through the results of the growth of samples of bodily substances taken from your mouth (cultures).  HOME CARE INSTRUCTIONS   · Rest as much as possible.  · Young children may suck on frozen juice bars or frozen ice pops. Older children and adults may gargle with a warm or cold liquid to help soothe the throat. (Mix ¼ tsp of salt in 8 oz of water, or use strong tea.)  · Use a cool-mist humidifier to lessen throat irritation and cough.  · Drink enough fluids to keep your urine clear or pale yellow.  · While the throat is very sore, eat soft or liquid foods such as milk, ice cream, soups, or milk drinks.  · Family members who develop a sore throat or fever should have a medical exam or throat culture.  · If your child has uvulitis and is taking antibiotic medicine, wait 24 hours or until his or her temperature is near normal (less than 100° F [37.8° C]) before allowing him or her to return to school or day care.  · Only take over-the-counter or prescription medicines for pain, discomfort, or fever as directed by your caregiver.  Ask when your test results will be ready. Make sure you get your test results.   SEEK MEDICAL CARE IF:   · You have an oral temperature above 102° F (38.9° C).  · You  develop large, tender lumps your the neck.  · Your child develops a rash.  · You cough up green, yellow-brown, or bloody substances.  SEEK IMMEDIATE MEDICAL CARE IF:   · You develop any new symptoms, such as vomiting, earache, severe headache, stiff neck, chest pain, or trouble breathing or swallowing.  · Your airway is blocked.  · You develop more severe throat pain along with drooling or voice changes.  Document Released: 12/14/2003 Document Revised: 07/28/2011 Document Reviewed: 07/11/2010  ExitCare® Patient Information ©2015 [employer]. This information is not intended to replace advice given to you by your health care provider. Make sure you discuss any questions you have with your health care provider.

## 2013-11-23 NOTE — ED Notes (Signed)
Pt states he was seen in the ED yesterday with the same. States he has followed all directions and has been doing salt water gargles and ibuprofen but the pain has gotten worse and now it is difficult to swallow.

## 2013-11-23 NOTE — ED Provider Notes (Signed)
CSN: [id number]     Arrival date & time 11/23/13  1928 History  This chart was scribed for non-physician practitioner, Elpidio AnisShari Guillermo Nehring, PA-C,working with Juliet RudeNathan R. Rubin PayorPickering, MD, by Karle PlumberJennifer Tensley, ED Scribe.  This patient was seen in room WTR6/WTR6 and the patient's care was started at 8:53 PM.  Chief Complaint  Patient presents with  . Sore Throat   The history is provided by the patient. No language interpreter was used.   HPI Comments:  Glean HessDonnell I Puopolo is a 33 y.o. male who presents to the Emergency Department complaining of worsening sore throat that started five days ago. He reports associated rhinorrhea and painful swallowing. Pt states he was seen here yesterday and was prescribed Ibuprofen which he has been taking as directed and advised to do salt water gargles that which he has been doing. He denies vomiting, cough or fever. Pt reports allergy to Theophylline only. Pt does not have a PCP.  Past Medical History  Diagnosis Date  . Asthma    History reviewed. No pertinent past surgical history. No family history on file. History  Substance Use Topics  . Smoking status: Never Smoker   . Smokeless tobacco: Never Used  . Alcohol Use: No    Review of Systems  Constitutional: Negative for fever.  HENT: Positive for sore throat.   Respiratory: Negative for cough.   Gastrointestinal: Negative for vomiting.  All other systems reviewed and are negative.   Allergies  Theophyllines  Home Medications   Prior to Admission medications   Medication Sig Start Date End Date Taking? Authorizing Provider  albuterol (PROVENTIL HFA;VENTOLIN HFA) 108 (90 BASE) MCG/ACT inhaler Inhale 1-2 puffs into the lungs every 4 (four) hours as needed for wheezing or shortness of breath.   Yes Historical Provider, MD  diphenhydrAMINE (BENADRYL) 25 MG tablet Take 25 mg by mouth every 6 (six) hours as needed for allergies.   Yes Historical Provider, MD  ibuprofen (ADVIL,MOTRIN) 200 MG tablet Take 400 mg  by mouth every 6 (six) hours as needed for moderate pain.   Yes Historical Provider, MD  Menthol (HALLS COUGH DROPS MT) Use as directed 1 drop in the mouth or throat every 2 (two) hours.   Yes Historical Provider, MD  phenol (CHLORASEPTIC) 1.4 % LIQD Use as directed 1-2 sprays in the mouth or throat every 4 (four) hours as needed for throat irritation / pain.   Yes Historical Provider, MD   Triage Vitals: BP 121/81  Pulse 93  Temp(Src) 99.5 F (37.5 C) (Oral)  Resp 16  Ht 5\' 11"  (1.803 m)  Wt 187 lb (84.823 kg)  BMI 26.09 kg/m2  SpO2 100% Physical Exam  Nursing note and vitals reviewed. Constitutional: He is oriented to person, place, and time. He appears well-developed and well-nourished.  HENT:  Head: Normocephalic and atraumatic.  Right Ear: Tympanic membrane normal.  Left Ear: Tympanic membrane normal.  Mouth/Throat: Oropharynx is clear and moist and mucous membranes are normal.  Uvula mildly swollen.  Eyes: EOM are normal.  Neck: Normal range of motion.  Cardiovascular: Normal rate, regular rhythm and normal heart sounds.  Exam reveals no gallop and no friction rub.   No murmur heard. Pulmonary/Chest: Effort normal and breath sounds normal. No respiratory distress. He has no wheezes. He has no rales.  Musculoskeletal: Normal range of motion.  Lymphadenopathy:    He has no cervical adenopathy.  Neurological: He is alert and oriented to person, place, and time.  Skin: Skin is warm and dry.  Psychiatric: He has a normal mood and affect. His behavior is normal.    ED Course  Procedures (including critical care time) DIAGNOSTIC STUDIES: Oxygen Saturation is 100% on RA, normal by my interpretation.   COORDINATION OF CARE: 8:57 PM- Will prescribe steroids and reassured pt to keep taking Ibuprofen and salt water gargles. Advised cool liquids to help with pain. Pt verbalizes understanding and agrees to plan.  Medications - No data to display  Labs Review Labs Reviewed - No  data to display  Imaging Review No results found.   EKG Interpretation None      MDM   Final diagnoses:  None    1. Uvulitis  Decadron given for inflammation. Given worsening symptoms will Rx Amoxil but advised patient to wait 2-3 days and fill if not getting better as anticipated with a viral process.   I personally performed the services described in this documentation, which was scribed in my presence. The recorded information has been reviewed and is accurate.    Arnoldo HookerShari A Matea Stanard, PA-C 11/23/13 2109

## 2013-11-23 NOTE — ED Provider Notes (Signed)
Medical screening examination/treatment/procedure(s) were performed by non-physician practitioner and as supervising physician I was immediately available for consultation/collaboration.   EKG Interpretation None       Landis Dowdy R. Prynce Jacober, MD 11/23/13 2344 

## 2013-11-24 LAB — CULTURE, GROUP A STREP

## 2016-04-07 ENCOUNTER — Ambulatory Visit (HOSPITAL_COMMUNITY)
Admission: EM | Admit: 2016-04-07 | Discharge: 2016-04-07 | Disposition: A | Discharge status: Home / Self Care | Payer: 59 | Attending: Emergency Medicine | Admitting: Emergency Medicine

## 2016-04-07 ENCOUNTER — Encounter (HOSPITAL_COMMUNITY): Payer: Self-pay | Admitting: Emergency Medicine

## 2016-04-07 DIAGNOSIS — J029 Acute pharyngitis, unspecified: Secondary | ICD-10-CM | POA: Diagnosis not present

## 2016-04-07 LAB — POCT RAPID STREP A: Streptococcus, Group A Screen (Direct): NEGATIVE

## 2016-04-07 MED ORDER — MOMETASONE FUROATE 50 MCG/ACT NA SUSP: 2.0000 | Freq: Every day | NASAL | 0 refills | Status: AC

## 2016-04-07 NOTE — ED Triage Notes (Signed)
[facility]. The patient reported that he has had a sore throat x 2 days and now reports a cough that he described as mildly productive as well as chest congestion. The patient denied any fevers.

## 2016-04-07 NOTE — ED Provider Notes (Signed)
HPI  SUBJECTIVE:  Patient reports sore throat starting 3 days ago. Sx worse with swallowing.  Sx better with nothing. Has been taking allegra and an herbal remedy  w/ o relief.  No Fever   + Cough/URI sxs- reports runny nose, rhinorrhea, postnasal drip and a cough occasionally productive of whitish sputum. No Myalgias No Headache No Rash     No Recent Strep or mono Exposure. Son currently has URI symptoms. No Abdominal Pain No reflux sxs No Allergy sxs  No Breathing difficulty, voice changes No Drooling No Trismus No abx in past month.  No antipyretic in past 4-6 hrs Patient has past medical history of asthma, allergies for which he takes Allegra daily. No history of GERD, mono, strep, diabetes, hypertension. PMD: None.  Past Medical History:  Diagnosis Date  . Asthma     History reviewed. No pertinent surgical history.  History reviewed. No pertinent family history.  Social History  Substance Use Topics  . Smoking status: Never Smoker  . Smokeless tobacco: Never Used  . Alcohol use No    No current facility-administered medications for this encounter.   Current Outpatient Prescriptions:  .  albuterol (PROVENTIL HFA;VENTOLIN HFA) 108 (90 BASE) MCG/ACT inhaler, Inhale 1-2 puffs into the lungs every 4 (four) hours as needed for wheezing or shortness of breath., Disp: , Rfl:  .  Menthol (HALLS COUGH DROPS MT), Use as directed 1 drop in the mouth or throat every 2 (two) hours., Disp: , Rfl:  .  mometasone (NASONEX) 50 MCG/ACT nasal spray, Place 2 sprays into the nose daily., Disp: 17 g, Rfl: 0 .  phenol (CHLORASEPTIC) 1.4 % LIQD, Use as directed 1-2 sprays in the mouth or throat every 4 (four) hours as needed for throat irritation / pain., Disp: , Rfl:   Allergies  Allergen Reactions  . Theophyllines Other (See Comments)    childhood allergy     ROS  As noted in HPI.   Physical Exam  BP 119/71 (BP Location: Right Arm)   Pulse 70   Temp 98 F (36.7 C)  (Oral)   Resp 16   SpO2 97%   Constitutional: Well developed, well nourished, no acute distress Eyes:  EOMI, conjunctiva normal bilaterally HENT: Normocephalic, atraumatic,mucus membranes moist. + mild nasal congestion + erythematous oropharynx  - enlarged tonsils - exudates. Uvula midline.  Respiratory: Normal inspiratory effort Cardiovascular: Normal rate, no murmurs, rubs, gallops GI: nondistended, nontender. No appreciable splenomegaly skin: No rash, skin intact Lymph: -  cervical LN  Musculoskeletal: no deformities Neurologic: Alert & oriented x 3, no focal neuro deficits Psychiatric: Speech and behavior appropriate.  ED Course   Medications - No data to display  Orders Placed This Encounter  Procedures  . POCT rapid strep A [facility])    Standing Status:   Standing    Number of Occurrences:   1    Results for orders placed or performed during the hospital encounter of 04/07/16 (from the past 24 hour(s))  POCT rapid strep A [facility])     Status: None   Collection Time: 04/07/16 11:47 AM  Result Value Ref Range   Streptococcus, Group A Screen (Direct) NEGATIVE NEGATIVE   No results found.  ED Clinical Impression  Pharyngitis, unspecified etiology   ED Assessment/Plan  Rapid strep negative. Obtaining throat culture to guide antibiotic treatment. Discussed this with patient. We'll contact them if culture is positive, and will call in Appropriate antibiotics. Patient home with Tylenol, Benadryl/Maalox mixture, Nasonex  to help with the nasal congestion, postnasal drip. Also help with his allergies. Also advised saline nasal irrigation.  will refer to local primary care resources. . Discussed labs, MDM, plan and followup with patient  Discussed sn/sx that should prompt return to the  ED. Patient agrees with plan.   Meds ordered this encounter  Medications  . mometasone (NASONEX) 50 MCG/ACT nasal spray    Sig: Place 2 sprays into the nose daily.     Dispense:  17 g    Refill:  0     *This clinic note was created using Scientist, clinical (histocompatibility and immunogenetics)Dragon dictation software. Therefore, there may be occasional mistakes despite careful proofreading.    Domenick GongAshley Mead Slane, MD 04/07/16 1153

## 2016-04-07 NOTE — Discharge Instructions (Signed)
your rapid strep was negative today, so we have sent off a throat culture.  We will contact you and call in the appropriate antibiotics if your culture comes back positive for an infection requiring antibiotic treatment.  Give us a working phone number. 1 gm Tylenol up to 4 times a day for pain.   Make sure you drink plenty of extra fluids.  Some people find salt water gargles and  Traditional Medicinal's "Throat Coat" tea helpful. Take 5 mL of liquid Benadryl and 5 mL of Maalox. Mix it together, and then hold it in your mouth for as long as you can and then swallow. You may do this 4 times a day.    Go to www.goodrx.com to look up your medications. This will give you a list of where you can find your prescriptions at the most affordable prices.   Below is a list of primary care practices who are taking new patients for you to follow-up with. [facility] [address] [place], [place] [address] [phone]  [facility] [phone] [address] [place] [address]  [facility]: [address] [place] [address]  [phone]  [facility]: [address] [place] [address]   [phone]  [facility]: [address] [place] [address]  [phone]  [facility] : [address] [place] [address] [phone]  [facility]: [address] [place] [phone]  [facility]: [address] [place] [id number] [phone]  Dr. Oneal GroutMahima Pandey [address] [facility] [address] [facility] [place] [id number]  [phone]  Dr. Jackie PlumGeorge Osei-Bonsu, [facility]. [address] [place], [place] [address]  [phone]

## 2016-04-10 LAB — CULTURE, GROUP A STREP (THRC)

## 2017-11-30 ENCOUNTER — Encounter: Payer: Self-pay | Admitting: Neurology

## 2017-12-01 ENCOUNTER — Encounter: Payer: Self-pay | Admitting: Neurology

## 2017-12-01 ENCOUNTER — Ambulatory Visit: Payer: Managed Care, Other (non HMO) | Admitting: Neurology

## 2017-12-01 VITALS — BP 116/78 | HR 57 | Ht 71.0 in | Wt 234.0 lb

## 2017-12-01 DIAGNOSIS — Z72820 Sleep deprivation: Secondary | ICD-10-CM | POA: Insufficient documentation

## 2017-12-01 DIAGNOSIS — R0683 Snoring: Secondary | ICD-10-CM | POA: Diagnosis not present

## 2017-12-01 DIAGNOSIS — G4719 Other hypersomnia: Secondary | ICD-10-CM | POA: Insufficient documentation

## 2017-12-01 DIAGNOSIS — G4726 Circadian rhythm sleep disorder, shift work type: Secondary | ICD-10-CM | POA: Diagnosis not present

## 2017-12-01 MED ORDER — MODAFINIL 200 MG PO TABS: 200.0000 mg | ORAL_TABLET | Freq: Every day | ORAL | 5 refills | Status: DC
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
## 2017-12-01 NOTE — Patient Instructions (Addendum)
Hypersomnia Hypersomnia is when you feel extremely tired during the day even though you're getting plenty of sleep at night. You may need to take naps during the day, and you may also be extremely difficult to wake up when you are sleeping. What are the causes? The cause of your hypersomnia may not be known. Hypersomnia may be caused by:  Medicines.  Sleep disorders, such as narcolepsy.  Trauma or injury to your head or nervous system.  Using drugs or alcohol. Modafinil tablets What is this medicine? MODAFINIL (moe DAF i nil) is used to treat excessive sleepiness caused by certain sleep disorders. This includes narcolepsy, sleep apnea, and shift work sleep disorder. This medicine may be used for other purposes; ask your health care provider or pharmacist if you have questions. COMMON BRAND NAME(S): Provigil What should I tell my health care provider before I take this medicine? They need to know if you have any of these conditions: -history of depression, mania, or other mental disorder -kidney disease -liver disease -an unusual or allergic reaction to modafinil, other medicines, foods, dyes, or preservatives -pregnant or trying to get pregnant -breast-feeding How should I use this medicine? Take this medicine by mouth with a glass of water. Follow the directions on the prescription label. Take your doses at regular intervals. Do not take your medicine more often than directed. Do not stop taking except on your doctor's advice. A special MedGuide will be given to you by the pharmacist with each prescription and refill. Be sure to read this information carefully each time. Talk to your pediatrician regarding the use of this medicine in children. This medicine is not approved for use in children. Overdosage: If you think you have taken too much of this medicine contact a poison control center or emergency room at once. NOTE: This medicine is only for you. Do not share this medicine with  others. What if I miss a dose? If you miss a dose, take it as soon as you can. If it is almost time for your next dose, take only that dose. Do not take double or extra doses. What may interact with this medicine? Do not take this medicine with any of the following medications: -amphetamine or dextroamphetamine -dexmethylphenidate or methylphenidate -medicines called MAO Inhibitors like Nardil, Parnate, Marplan, Eldepryl -pemoline -procarbazine This medicine may also interact with the following medications: -antifungal medicines like itraconazole or ketoconazole -barbiturates like phenobarbital -birth control pills or other hormone-containing birth control devices or implants -carbamazepine -cyclosporine -diazepam -medicines for depression, anxiety, or psychotic disturbances -phenytoin -propranolol -triazolam -warfarin This list may not describe all possible interactions. Give your health care provider a list of all the medicines, herbs, non-prescription drugs, or dietary supplements you use. Also tell them if you smoke, drink alcohol, or use illegal drugs. Some items may interact with your medicine. What should I watch for while using this medicine? Visit your doctor or health care professional for regular checks on your progress. The full effects of this medicine may not be seen right away. This medicine may affect your concentration, function, or may hide signs that you are tired. You may get dizzy. Do not drive, use machinery, or do anything that needs mental alertness until you know how this drug affects you. Alcohol can make you more dizzy and may interfere with your response to this medicine or your alertness. Avoid alcoholic drinks. Birth control pills may not work properly while you are taking this medicine. Talk to your doctor about using an  extra method of birth control. It is unknown if the effects of this medicine will be increased by the use of caffeine. Caffeine is available  in many foods, beverages, and medications. Ask your doctor if you should limit or change your intake of caffeine-containing products while on this medicine. What side effects may I notice from receiving this medicine? Side effects that you should report to your doctor or health care professional as soon as possible: -allergic reactions like skin rash, itching or hives, swelling of the face, lips, or tongue -anxiety -breathing problems -chest pain -fast, irregular heartbeat -hallucinations -increased blood pressure -redness, blistering, peeling or loosening of the skin, including inside the mouth -sore throat, fever, or chills -suicidal thoughts or other mood changes -tremors -vomiting Side effects that usually do not require medical attention (report to your doctor or health care professional if they continue or are bothersome): -headache -nausea, diarrhea, or stomach upset -nervousness -trouble sleeping This list may not describe all possible side effects. Call your doctor for medical advice about side effects. You may report side effects to FDA at 1-800-FDA-1088. Where should I keep my medicine? Keep out of the reach of children. This medicine can be abused. Keep your medicine in a safe place to protect it from theft. Do not share this medicine with anyone. Selling or giving away this medicine is dangerous and against the law. This medicine may cause accidental overdose and death if taken by other adults, children, or pets. Mix any unused medicine with a substance like cat litter or coffee grounds. Then throw the medicine away in a sealed container like a sealed bag or a coffee can with a lid. Do not use the medicine after the expiration date. Store at room temperature between 20 and 25 degrees C (68 and 77 degrees F). NOTE: This sheet is a summary. It may not cover all possible information. If you have questions about this medicine, talk to your doctor, pharmacist, or health care  provider.  2018 Elsevier/Gold Standard (2014-01-24 15:34:55)    Medical conditions, such as depression or hypothyroidism.  Genetics.  What are the signs or symptoms? The main symptoms of hypersomnia include:  Feeling extremely tired throughout the day.  Being very difficult to wake up.  Sleeping for longer and longer periods.  Taking naps throughout the day.  Other symptoms may include:  Feeling: ? Restless. ? Annoyed. ? Anxious. ? Low energy.  Having difficulty: ? Remembering. ? Speaking. ? Thinking.  Losing your appetite.  Experiencing hallucinations.  How is this diagnosed? Hypersomnia may be diagnosed by:  Medical history and physical exam. This will include a sleep history.  Completing sleep logs.  Tests may also be done, such as: ? Polysomnography. ? Multiple sleep latency test (MSLT).  How is this treated? There is no cure for hypersomnia, but treatment can be very effective in helping manage the condition. Treatment may include:  Lifestyle and sleeping strategies to help cope with the condition.  Stimulant medicines.  Treating any underlying causes of hypersomnia.  Follow these instructions at home:  Take medicines only as directed by your health care provider.  Schedule short naps for when you feel sleepiest during the day. Tell your employer or teachers that you have hypersomnia. You may be able to adjust your schedule to include time for naps.  Avoid drinking alcohol or caffeinated beverages.  Do not eat a heavy meal before bedtime. Eat at about the same times every day.  Do not drive or operate heavy  machinery if you are sleepy.  Do not swim or go out on the water without a life jacket.  If possible, adjust your schedule so that you do not have to work or be active at night.  Keep all follow-up visits as directed by your health care provider. This is important. Contact a health care provider if:  You have new symptoms.  Your  symptoms get worse. Get help right away if: You have serious thoughts of hurting yourself or someone else. This information is not intended to replace advice given to you by your health care provider. Make sure you discuss any questions you have with your health care provider. Document Released: 04/25/2002 Document Revised: 10/11/2015 Document Reviewed: 12/08/2013 Elsevier Interactive Patient Education  [employer].

## 2017-12-02 ENCOUNTER — Telehealth: Payer: Self-pay | Admitting: Neurology

## 2017-12-02 NOTE — Telephone Encounter (Signed)
PA submitted and completed through cover my meds on [facility]. Approved until 06/04/2018.  KEY: [id number]

## 2017-12-17 ENCOUNTER — Ambulatory Visit (INDEPENDENT_AMBULATORY_CARE_PROVIDER_SITE_OTHER): Payer: Managed Care, Other (non HMO) | Admitting: Neurology

## 2017-12-17 DIAGNOSIS — G4733 Obstructive sleep apnea (adult) (pediatric): Secondary | ICD-10-CM

## 2017-12-17 DIAGNOSIS — G4726 Circadian rhythm sleep disorder, shift work type: Secondary | ICD-10-CM

## 2017-12-17 DIAGNOSIS — Z72820 Sleep deprivation: Secondary | ICD-10-CM

## 2017-12-17 DIAGNOSIS — G4719 Other hypersomnia: Secondary | ICD-10-CM

## 2017-12-24 ENCOUNTER — Telehealth: Payer: Self-pay | Admitting: Neurology

## 2017-12-24 NOTE — Addendum Note (Signed)
Addended by: Melvyn NovasHMEIER, Dewayne Severe on: 12/24/2017 08:33 AM   Modules accepted: Orders

## 2017-12-24 NOTE — Telephone Encounter (Signed)
I called pt. I advised pt that Dr. Vickey Hugerohmeier reviewed their sleep study results and found that pt has moderate sleep apnea. Dr. Vickey Hugerohmeier recommends that pt starts auto CPAP. I reviewed PAP compliance expectations with the pt. Pt is agreeable to starting a CPAP. I advised pt that an order will be sent to a DME, [facility], and [facility] will call the pt within about one week after they file with the pt's insurance. [facility] will show the pt how to use the machine, fit for masks, and troubleshoot the CPAP if needed. A follow up appt was made for insurance purposes with Dr. Vickey Hugerohmeier on Oct 31,2019 at 8:30 am. Pt verbalized understanding to arrive 15 minutes early and bring their CPAP. A letter with all of this information in it will be mailed to the pt as a reminder. I verified with the pt that the address we have on file is correct. Pt verbalized understanding of results. Pt had no questions at this time but was encouraged to call back if questions arise.

## 2017-12-24 NOTE — Telephone Encounter (Signed)
-----   Message from Melvyn Novasarmen Dohmeier, MD sent at 12/24/2017  8:33 AM EDT ----- IMPRESSION: 1) Moderate obstructive sleep apnea, with severe snoring.  2) Tachy-bradycardia without clinically relevant desaturation. RECOMMENDATION:  CPAP therapy auto-titration 5-15 cm water, 3 cm EPR, heated  humidity. The patient needs to set 6 hours for daytime sleep aside. Shift work and OSA sleep disorders allow for prescription  stimulant therapy.
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
## 2018-01-17 ENCOUNTER — Emergency Department (HOSPITAL_COMMUNITY)
Admission: EM | Admit: 2018-01-17 | Discharge: 2018-01-17 | Disposition: A | Discharge status: Home / Self Care | Payer: No Typology Code available for payment source | Attending: Emergency Medicine | Admitting: Emergency Medicine

## 2018-01-17 ENCOUNTER — Other Ambulatory Visit: Payer: Self-pay

## 2018-01-17 ENCOUNTER — Emergency Department (HOSPITAL_COMMUNITY): Payer: No Typology Code available for payment source

## 2018-01-17 ENCOUNTER — Encounter (HOSPITAL_COMMUNITY): Payer: Self-pay | Admitting: Emergency Medicine

## 2018-01-17 DIAGNOSIS — S99911A Unspecified injury of right ankle, initial encounter: Secondary | ICD-10-CM | POA: Diagnosis present

## 2018-01-17 DIAGNOSIS — Y9389 Activity, other specified: Secondary | ICD-10-CM | POA: Insufficient documentation

## 2018-01-17 DIAGNOSIS — M79671 Pain in right foot: Secondary | ICD-10-CM

## 2018-01-17 DIAGNOSIS — Y999 Unspecified external cause status: Secondary | ICD-10-CM | POA: Insufficient documentation

## 2018-01-17 DIAGNOSIS — J45909 Unspecified asthma, uncomplicated: Secondary | ICD-10-CM | POA: Diagnosis not present

## 2018-01-17 DIAGNOSIS — Y9289 Other specified places as the place of occurrence of the external cause: Secondary | ICD-10-CM | POA: Diagnosis not present

## 2018-01-17 DIAGNOSIS — X500XXA Overexertion from strenuous movement or load, initial encounter: Secondary | ICD-10-CM | POA: Diagnosis not present

## 2018-01-17 DIAGNOSIS — Z79899 Other long term (current) drug therapy: Secondary | ICD-10-CM | POA: Insufficient documentation

## 2018-01-17 DIAGNOSIS — S93401A Sprain of unspecified ligament of right ankle, initial encounter: Secondary | ICD-10-CM | POA: Insufficient documentation

## 2018-01-17 MED ORDER — ACETAMINOPHEN 325 MG PO TABS
650.0000 mg | ORAL_TABLET | Freq: Once | ORAL | Status: AC
  Administered 2018-01-17: 650 mg via ORAL
  Filled 2018-01-17: qty 2

## 2018-01-17 NOTE — ED Notes (Signed)
ED Provider at bedside. 

## 2018-01-17 NOTE — Discharge Instructions (Addendum)
Discussed her x-ray findings with you.  Please wear the cam walker for comfort.  Use the crutches for weightbearing as tolerated.  Please follow-up with orthopedic doctor this week. Please rest, ice, compress and elevated the affected body part to help with swelling and pain.  Take Motrin and Tylenol at home for pain.

## 2018-01-17 NOTE — ED Notes (Signed)
Applied cam walker to Pt's right leg. Went over crutches instructions with Pt. Pt demonstrated understanding. Pt ambulated out of facility with spouse
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
## 2018-01-17 NOTE — ED Notes (Signed)
X-ray at bedside

## 2018-01-17 NOTE — ED Notes (Signed)
Pt refuses to change into gown

## 2018-01-17 NOTE — ED Triage Notes (Signed)
Pt reports twisting right ankle at work around 1200am  Today.

## 2018-01-22 ENCOUNTER — Encounter (INDEPENDENT_AMBULATORY_CARE_PROVIDER_SITE_OTHER): Payer: Self-pay | Admitting: Orthopedic Surgery

## 2018-01-22 ENCOUNTER — Ambulatory Visit (INDEPENDENT_AMBULATORY_CARE_PROVIDER_SITE_OTHER): Payer: Worker's Compensation | Admitting: Orthopedic Surgery

## 2018-01-22 VITALS — Ht 71.25 in | Wt 234.0 lb

## 2018-01-22 DIAGNOSIS — S93411A Sprain of calcaneofibular ligament of right ankle, initial encounter: Secondary | ICD-10-CM
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
## 2018-01-25 ENCOUNTER — Telehealth (INDEPENDENT_AMBULATORY_CARE_PROVIDER_SITE_OTHER): Payer: Self-pay | Admitting: Orthopedic Surgery

## 2018-01-25 NOTE — Telephone Encounter (Signed)
Please advise. Patient would like to know what you want him to do. He states that he has to work because he has bills and needs income. I advised him that we do not have any closed toe fracture boots to provide him so he would like to know what suggestions that you have since he needs to work but his employer is stating he can only work if he has closed toe.

## 2018-01-25 NOTE — Telephone Encounter (Signed)
Patient called stating his employer sent him home today due to an open toe boot. Patient employer provided patient with paperwork for Dr.Dean to fill out pertaining to patient accommodations. I advised patient to fax paperwork over to the office number [phone]. Mr.Fung stated if he could have a new prescription provided for a closed toe boot his employer will make sure they assist him with any accommodations, patient would like to return to work by Wednesday. I did advise patient that doctor wouldn't be a bale to complete is paperwork within the hour.

## 2018-01-25 NOTE — Telephone Encounter (Signed)
IC LM for patient to [facility] to discuss option that Dr August Saucer gave for treatment options.

## 2018-01-25 NOTE — Telephone Encounter (Signed)
Patient called back and is agreeable to trying ASO with shoe but states he still needs some clarification on restrictions he has while at work? Please advise. Thanks.

## 2018-01-25 NOTE — Telephone Encounter (Signed)
Ok for aso and closed toe boot shoe pls clal thx

## 2018-01-26 ENCOUNTER — Ambulatory Visit (INDEPENDENT_AMBULATORY_CARE_PROVIDER_SITE_OTHER): Payer: Self-pay

## 2018-01-26 NOTE — Telephone Encounter (Signed)
IC sw patient advised form complete. He is coming around 1pm to be fitted for ASO

## 2018-01-26 NOTE — Telephone Encounter (Signed)
With his injury he basically should not do any prolonged walking more than an hour at a time.  Short of that he should be okay to do essentially what ever he needs to do and that ASO.  His ankle will swell and will likely hurt some but is on his he is in the ASO it should be relatively stable and he should be able to get through the day.  The only thing he should not do is walk for more than 1 hour at a time.

## 2018-02-12 ENCOUNTER — Ambulatory Visit (INDEPENDENT_AMBULATORY_CARE_PROVIDER_SITE_OTHER): Payer: Worker's Compensation

## 2018-02-12 ENCOUNTER — Encounter (INDEPENDENT_AMBULATORY_CARE_PROVIDER_SITE_OTHER): Payer: Self-pay | Admitting: Orthopedic Surgery

## 2018-02-12 ENCOUNTER — Ambulatory Visit (INDEPENDENT_AMBULATORY_CARE_PROVIDER_SITE_OTHER): Payer: Worker's Compensation | Admitting: Orthopedic Surgery

## 2018-02-12 DIAGNOSIS — S93411A Sprain of calcaneofibular ligament of right ankle, initial encounter: Secondary | ICD-10-CM | POA: Diagnosis not present
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
## 2018-03-03 ENCOUNTER — Encounter (INDEPENDENT_AMBULATORY_CARE_PROVIDER_SITE_OTHER): Payer: Self-pay | Admitting: Orthopedic Surgery

## 2018-03-03 ENCOUNTER — Ambulatory Visit (INDEPENDENT_AMBULATORY_CARE_PROVIDER_SITE_OTHER): Payer: Worker's Compensation | Admitting: Orthopedic Surgery

## 2018-03-03 DIAGNOSIS — S93411A Sprain of calcaneofibular ligament of right ankle, initial encounter: Secondary | ICD-10-CM | POA: Diagnosis not present

## 2018-03-05 ENCOUNTER — Encounter (INDEPENDENT_AMBULATORY_CARE_PROVIDER_SITE_OTHER): Payer: Self-pay | Admitting: Orthopedic Surgery
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
## 2018-03-18 ENCOUNTER — Ambulatory Visit: Payer: Self-pay | Admitting: Neurology

## 2018-03-23 ENCOUNTER — Telehealth (INDEPENDENT_AMBULATORY_CARE_PROVIDER_SITE_OTHER): Payer: Self-pay | Admitting: Orthopedic Surgery

## 2018-03-23 NOTE — Telephone Encounter (Signed)
Patient called asked if Dr August Saucer would extend his accommodations until after he complete (PT) The number to contact patient is [phone]

## 2018-03-24 NOTE — Telephone Encounter (Signed)
Ok to extend work note 

## 2018-03-24 NOTE — Telephone Encounter (Signed)
IC advised note done could pick up at front desk.  

## 2018-03-24 NOTE — Telephone Encounter (Signed)
y

## 2018-04-02 ENCOUNTER — Ambulatory Visit (INDEPENDENT_AMBULATORY_CARE_PROVIDER_SITE_OTHER): Payer: Worker's Compensation

## 2018-04-02 ENCOUNTER — Encounter (INDEPENDENT_AMBULATORY_CARE_PROVIDER_SITE_OTHER): Payer: Self-pay | Admitting: Orthopedic Surgery

## 2018-04-02 ENCOUNTER — Ambulatory Visit (INDEPENDENT_AMBULATORY_CARE_PROVIDER_SITE_OTHER): Payer: Worker's Compensation | Admitting: Orthopedic Surgery

## 2018-04-02 DIAGNOSIS — S93411A Sprain of calcaneofibular ligament of right ankle, initial encounter: Secondary | ICD-10-CM
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
## 2018-04-19 ENCOUNTER — Encounter: Payer: Self-pay | Admitting: Adult Health

## 2018-04-19 ENCOUNTER — Ambulatory Visit (INDEPENDENT_AMBULATORY_CARE_PROVIDER_SITE_OTHER): Payer: BLUE CROSS/BLUE SHIELD | Admitting: Adult Health

## 2018-04-19 VITALS — BP 128/75 | HR 71 | Ht 71.25 in | Wt 247.4 lb

## 2018-04-19 DIAGNOSIS — Z9989 Dependence on other enabling machines and devices: Secondary | ICD-10-CM | POA: Diagnosis not present

## 2018-04-19 DIAGNOSIS — G4733 Obstructive sleep apnea (adult) (pediatric): Secondary | ICD-10-CM
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
## 2018-04-19 NOTE — Progress Notes (Signed)
cpap DME [facility] Received: Today  Message Contents  Kiger, Richrd Soxaylor  Young, Sandra S, RN  got it

## 2018-04-19 NOTE — Patient Instructions (Signed)
Your Plan:  Continue using CPAP nightly and greater than 4 hours each night Change pressure 5-20cm h20 If your symptoms worsen or you develop new symptoms please let us know.   Please call [facility] at [phone], and press option 1 when prompted. Their customer service representatives will be glad to assist you. If they are unable to answer, please leave a message and they will call you back. Make sure to leave your name and return phone number.   Thank you for coming to see us at [facility]. I hope we have been able to provide you high quality care today.  You may receive a patient satisfaction survey over the next few weeks. We would appreciate your feedback and comments so that we may continue to improve ourselves and the health of our patients.

## 2018-05-07 DIAGNOSIS — R9431 Abnormal electrocardiogram [ECG] [EKG]: Secondary | ICD-10-CM | POA: Diagnosis not present

## 2018-05-07 DIAGNOSIS — R0789 Other chest pain: Secondary | ICD-10-CM | POA: Diagnosis not present

## 2018-05-07 DIAGNOSIS — J45909 Unspecified asthma, uncomplicated: Secondary | ICD-10-CM | POA: Diagnosis not present

## 2018-05-07 DIAGNOSIS — R079 Chest pain, unspecified: Secondary | ICD-10-CM | POA: Diagnosis not present

## 2018-05-07 DIAGNOSIS — R0602 Shortness of breath: Secondary | ICD-10-CM | POA: Diagnosis not present

## 2018-05-07 DIAGNOSIS — R110 Nausea: Secondary | ICD-10-CM | POA: Diagnosis not present

## 2018-05-07 DIAGNOSIS — M545 Low back pain: Secondary | ICD-10-CM | POA: Diagnosis not present

## 2018-05-07 DIAGNOSIS — R109 Unspecified abdominal pain: Secondary | ICD-10-CM | POA: Diagnosis not present

## 2018-05-26 DIAGNOSIS — G4733 Obstructive sleep apnea (adult) (pediatric): Secondary | ICD-10-CM | POA: Diagnosis not present

## 2018-05-31 ENCOUNTER — Telehealth (INDEPENDENT_AMBULATORY_CARE_PROVIDER_SITE_OTHER): Payer: Self-pay | Admitting: Orthopedic Surgery

## 2018-05-31 MED ORDER — TRAMADOL HCL 50 MG PO TABS: 50.0000 mg | ORAL_TABLET | Freq: Two times a day (BID) | ORAL | 0 refills | Status: DC | PRN

## 2018-05-31 NOTE — Telephone Encounter (Signed)
Patient called asked if he can get some pain medicine for the pain in his right foot. Patient said his foot is swollen and very painful. Patient said he is on his feet all day at work. The number to contact patient is [phone]

## 2018-05-31 NOTE — Telephone Encounter (Signed)
Please advise 

## 2018-05-31 NOTE — Telephone Encounter (Signed)
Called to pharmacy.  I left voicemail for patient advising. Also advised patient needs to call and schedule follow up appt for next week.

## 2018-05-31 NOTE — Telephone Encounter (Signed)
Okay for tramadol 1 p.o. every 12 hours as needed pain #30+ return office visit next week.  Please call thanks.

## 2018-06-07 ENCOUNTER — Ambulatory Visit (INDEPENDENT_AMBULATORY_CARE_PROVIDER_SITE_OTHER): Payer: Self-pay | Admitting: Orthopedic Surgery

## 2018-06-16 ENCOUNTER — Ambulatory Visit (INDEPENDENT_AMBULATORY_CARE_PROVIDER_SITE_OTHER): Payer: Worker's Compensation | Admitting: Orthopedic Surgery

## 2018-06-16 ENCOUNTER — Encounter (INDEPENDENT_AMBULATORY_CARE_PROVIDER_SITE_OTHER): Payer: Self-pay | Admitting: Orthopedic Surgery

## 2018-06-16 DIAGNOSIS — S93411A Sprain of calcaneofibular ligament of right ankle, initial encounter: Secondary | ICD-10-CM | POA: Diagnosis not present

## 2018-06-17 ENCOUNTER — Telehealth (INDEPENDENT_AMBULATORY_CARE_PROVIDER_SITE_OTHER): Payer: Self-pay

## 2018-06-17 NOTE — Telephone Encounter (Signed)
Fax yesterdays office note and MRI order to [facility] adj Alona Bene when ready

## 2018-06-20 ENCOUNTER — Encounter (INDEPENDENT_AMBULATORY_CARE_PROVIDER_SITE_OTHER): Payer: Self-pay | Admitting: Orthopedic Surgery
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
## 2018-06-21 NOTE — Telephone Encounter (Signed)
done

## 2018-06-26 DIAGNOSIS — G4733 Obstructive sleep apnea (adult) (pediatric): Secondary | ICD-10-CM | POA: Diagnosis not present

## 2018-06-30 ENCOUNTER — Other Ambulatory Visit: Payer: Self-pay

## 2018-06-30 ENCOUNTER — Emergency Department (HOSPITAL_COMMUNITY): Payer: BLUE CROSS/BLUE SHIELD

## 2018-06-30 ENCOUNTER — Emergency Department (HOSPITAL_COMMUNITY)
Admission: EM | Admit: 2018-06-30 | Discharge: 2018-06-30 | Disposition: A | Discharge status: Home / Self Care | Payer: BLUE CROSS/BLUE SHIELD | Attending: Emergency Medicine | Admitting: Emergency Medicine

## 2018-06-30 DIAGNOSIS — J45909 Unspecified asthma, uncomplicated: Secondary | ICD-10-CM | POA: Diagnosis not present

## 2018-06-30 DIAGNOSIS — R0789 Other chest pain: Secondary | ICD-10-CM

## 2018-06-30 DIAGNOSIS — Z79899 Other long term (current) drug therapy: Secondary | ICD-10-CM | POA: Diagnosis not present

## 2018-06-30 DIAGNOSIS — R0602 Shortness of breath: Secondary | ICD-10-CM | POA: Insufficient documentation

## 2018-06-30 DIAGNOSIS — R079 Chest pain, unspecified: Secondary | ICD-10-CM | POA: Diagnosis not present

## 2018-06-30 LAB — CBC
HCT: 42.7 % (ref 39.0–52.0)
HEMOGLOBIN: 14.9 g/dL (ref 13.0–17.0)
MCH: 29.4 pg (ref 26.0–34.0)
MCHC: 34.9 g/dL (ref 30.0–36.0)
MCV: 84.4 fL (ref 80.0–100.0)
NRBC: 0 % (ref 0.0–0.2)
PLATELETS: 209 10*3/uL (ref 150–400)
RBC: 5.06 MIL/uL (ref 4.22–5.81)
RDW: 13.5 % (ref 11.5–15.5)
WBC: 7.2 10*3/uL (ref 4.0–10.5)

## 2018-06-30 LAB — BASIC METABOLIC PANEL
ANION GAP: 11 (ref 5–15)
BUN: 10 mg/dL (ref 6–20)
CALCIUM: 9.3 mg/dL (ref 8.9–10.3)
CO2: 26 mmol/L (ref 22–32)
Chloride: 102 mmol/L (ref 98–111)
Creatinine, Ser: 1.02 mg/dL (ref 0.61–1.24)
Glucose, Bld: 105 mg/dL — ABNORMAL HIGH (ref 70–99)
POTASSIUM: 4 mmol/L (ref 3.5–5.1)
SODIUM: 139 mmol/L (ref 135–145)

## 2018-06-30 LAB — I-STAT TROPONIN, ED
TROPONIN I, POC: 0 ng/mL (ref 0.00–0.08)
Troponin i, poc: 0 ng/mL (ref 0.00–0.08)

## 2018-06-30 MED ORDER — SODIUM CHLORIDE 0.9% FLUSH: 3.0000 mL | Freq: Once | INTRAVENOUS | Status: DC

## 2018-06-30 NOTE — ED Notes (Signed)
Discharge instructions discussed with Pt. Pt verbalized understanding. Pt stable and ambulatory.    

## 2018-06-30 NOTE — ED Triage Notes (Signed)
Pt reports chest pain that started last night- pt states he was seen at an ED in dec for same and did not find a cause. Pt denies any associated sxs pt has no sob, dizziness or syncope.

## 2018-06-30 NOTE — Discharge Instructions (Signed)
Follow-up with your primary care provider. Return to the ED for worsening symptoms, increased chest pain, shortness of breath, leg swelling, vomiting or coughing up blood.
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
## 2018-07-01 ENCOUNTER — Other Ambulatory Visit: Payer: Self-pay

## 2018-07-02 ENCOUNTER — Ambulatory Visit
Admission: EM | Admit: 2018-07-02 | Discharge: 2018-07-02 | Discharge status: Absent without leave | Payer: BLUE CROSS/BLUE SHIELD

## 2018-07-05 DIAGNOSIS — Z131 Encounter for screening for diabetes mellitus: Secondary | ICD-10-CM | POA: Diagnosis not present

## 2018-07-05 DIAGNOSIS — Z13220 Encounter for screening for lipoid disorders: Secondary | ICD-10-CM | POA: Diagnosis not present

## 2018-07-05 DIAGNOSIS — Z8249 Family history of ischemic heart disease and other diseases of the circulatory system: Secondary | ICD-10-CM | POA: Diagnosis not present

## 2018-07-05 DIAGNOSIS — Z0000 Encounter for general adult medical examination without abnormal findings: Secondary | ICD-10-CM | POA: Diagnosis not present

## 2018-07-08 DIAGNOSIS — F4323 Adjustment disorder with mixed anxiety and depressed mood: Secondary | ICD-10-CM | POA: Diagnosis not present

## 2018-07-19 ENCOUNTER — Encounter (INDEPENDENT_AMBULATORY_CARE_PROVIDER_SITE_OTHER): Payer: Self-pay | Admitting: Orthopedic Surgery

## 2018-07-19 ENCOUNTER — Ambulatory Visit (INDEPENDENT_AMBULATORY_CARE_PROVIDER_SITE_OTHER): Payer: Worker's Compensation | Admitting: Orthopedic Surgery

## 2018-07-19 DIAGNOSIS — S93411A Sprain of calcaneofibular ligament of right ankle, initial encounter: Secondary | ICD-10-CM
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
## 2018-08-30 ENCOUNTER — Encounter (INDEPENDENT_AMBULATORY_CARE_PROVIDER_SITE_OTHER): Payer: Self-pay | Admitting: Orthopedic Surgery

## 2018-08-30 ENCOUNTER — Ambulatory Visit (INDEPENDENT_AMBULATORY_CARE_PROVIDER_SITE_OTHER): Payer: Worker's Compensation | Admitting: Orthopedic Surgery

## 2018-08-30 ENCOUNTER — Other Ambulatory Visit: Payer: Self-pay

## 2018-08-30 DIAGNOSIS — S93411A Sprain of calcaneofibular ligament of right ankle, initial encounter: Secondary | ICD-10-CM | POA: Diagnosis not present
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
## 2018-10-14 ENCOUNTER — Telehealth: Payer: Self-pay | Admitting: Neurology

## 2018-10-14 NOTE — Telephone Encounter (Signed)
Due to current COVID 19 pandemic, our office is severely reducing in office visits until further notice, in order to minimize the risk to our patients and healthcare providers.  °  °Called patient and confirmed a virtual visit for his 6/3 appointment. Patient verbalized understanding of the [email or web address] process and I have sent him an e-mail with link and directions as well as my name and office number/hours for reference. Patient understands that he will receive a call from RN to update chart. °  °Pt understands that although there may be some limitations with this type of visit, we will take all precautions to reduce any security or privacy concerns.  Pt understands that this will be treated like an in office visit and we will file with pt's insurance, and there may be a patient responsible charge related to this service. °  °

## 2018-10-17 ENCOUNTER — Encounter: Payer: Self-pay | Admitting: Neurology

## 2018-10-19 ENCOUNTER — Encounter: Payer: Self-pay | Admitting: Neurology

## 2018-10-19 NOTE — Telephone Encounter (Signed)

## 2018-10-20 ENCOUNTER — Other Ambulatory Visit: Payer: Self-pay

## 2018-10-20 ENCOUNTER — Ambulatory Visit (INDEPENDENT_AMBULATORY_CARE_PROVIDER_SITE_OTHER): Payer: Self-pay | Admitting: Neurology

## 2018-10-20 ENCOUNTER — Encounter: Payer: Self-pay | Admitting: Neurology

## 2018-10-20 DIAGNOSIS — G4726 Circadian rhythm sleep disorder, shift work type: Secondary | ICD-10-CM

## 2018-10-20 DIAGNOSIS — Z9989 Dependence on other enabling machines and devices: Secondary | ICD-10-CM

## 2018-10-20 DIAGNOSIS — Z72820 Sleep deprivation: Secondary | ICD-10-CM

## 2018-10-20 DIAGNOSIS — R0683 Snoring: Secondary | ICD-10-CM

## 2018-10-20 DIAGNOSIS — G4719 Other hypersomnia: Secondary | ICD-10-CM

## 2018-10-20 DIAGNOSIS — G4733 Obstructive sleep apnea (adult) (pediatric): Secondary | ICD-10-CM
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
## 2019-01-23 IMAGING — DX DG FOOT COMPLETE 3+V*R*
3 series · 3 of 3 positions shown · non-contrast
Comparison: None.

CLINICAL DATA: Twisted ankle.  Pain.

EXAM:
RIGHT ANKLE - COMPLETE 3+ VIEW; RIGHT FOOT COMPLETE - 3+ VIEW

[foot ap]
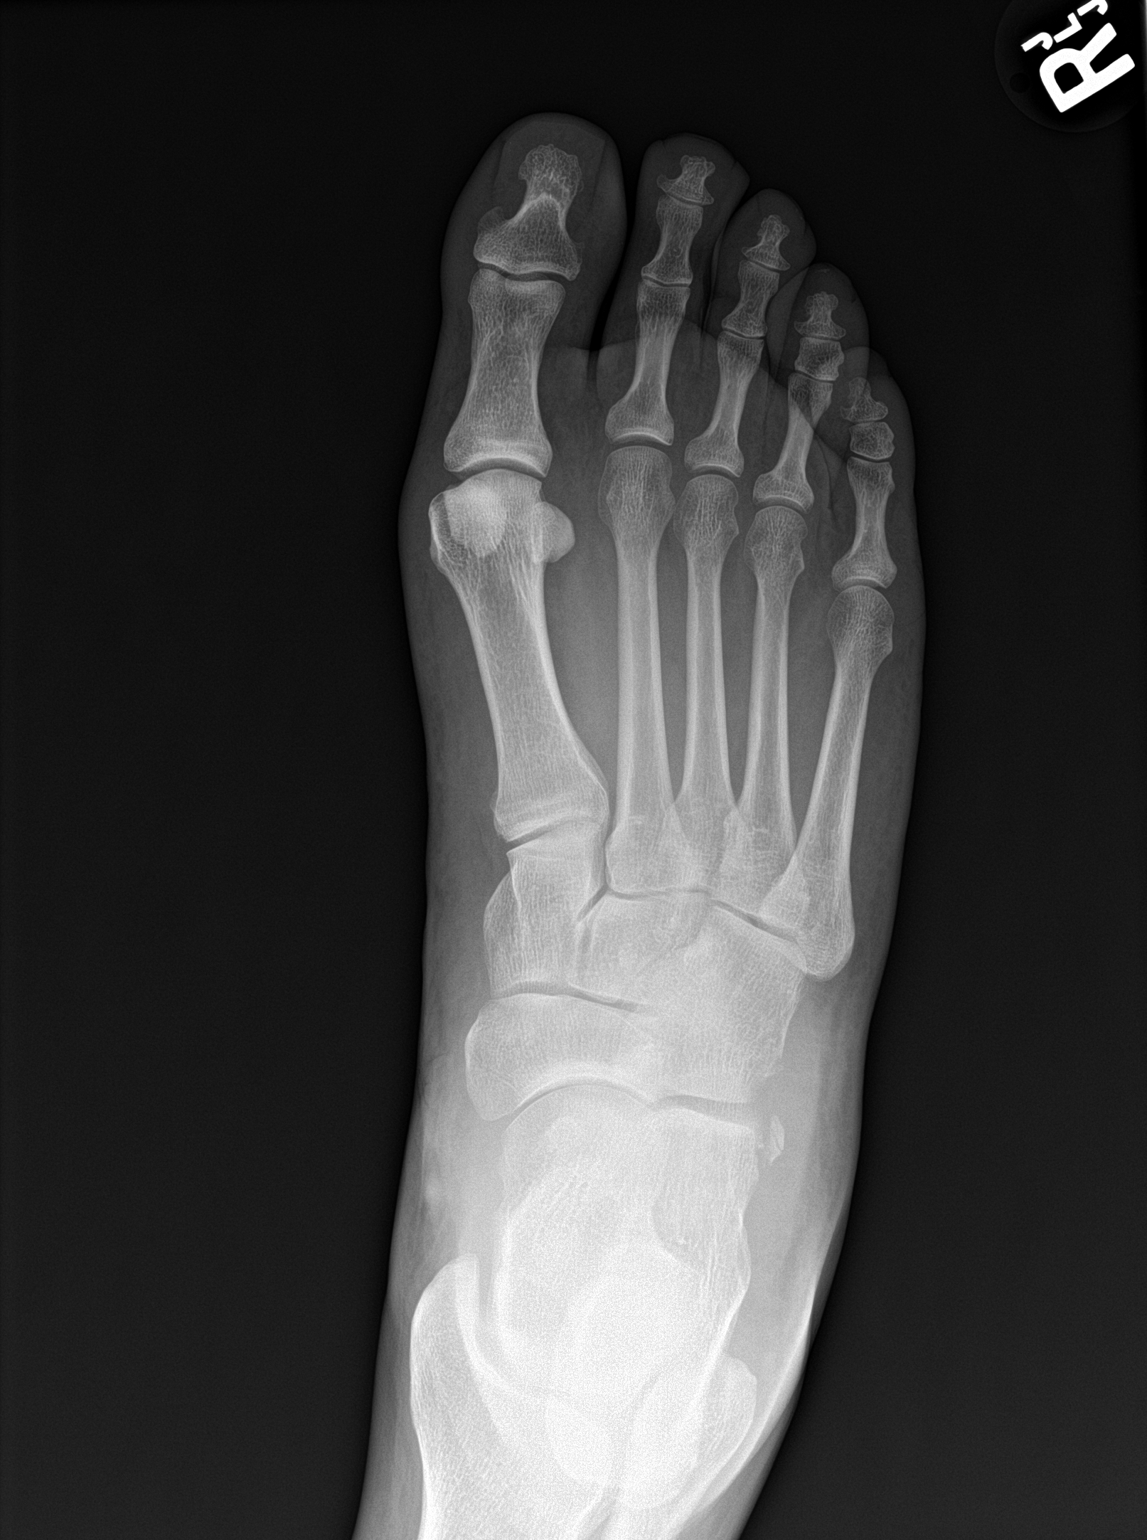

[foot obl]
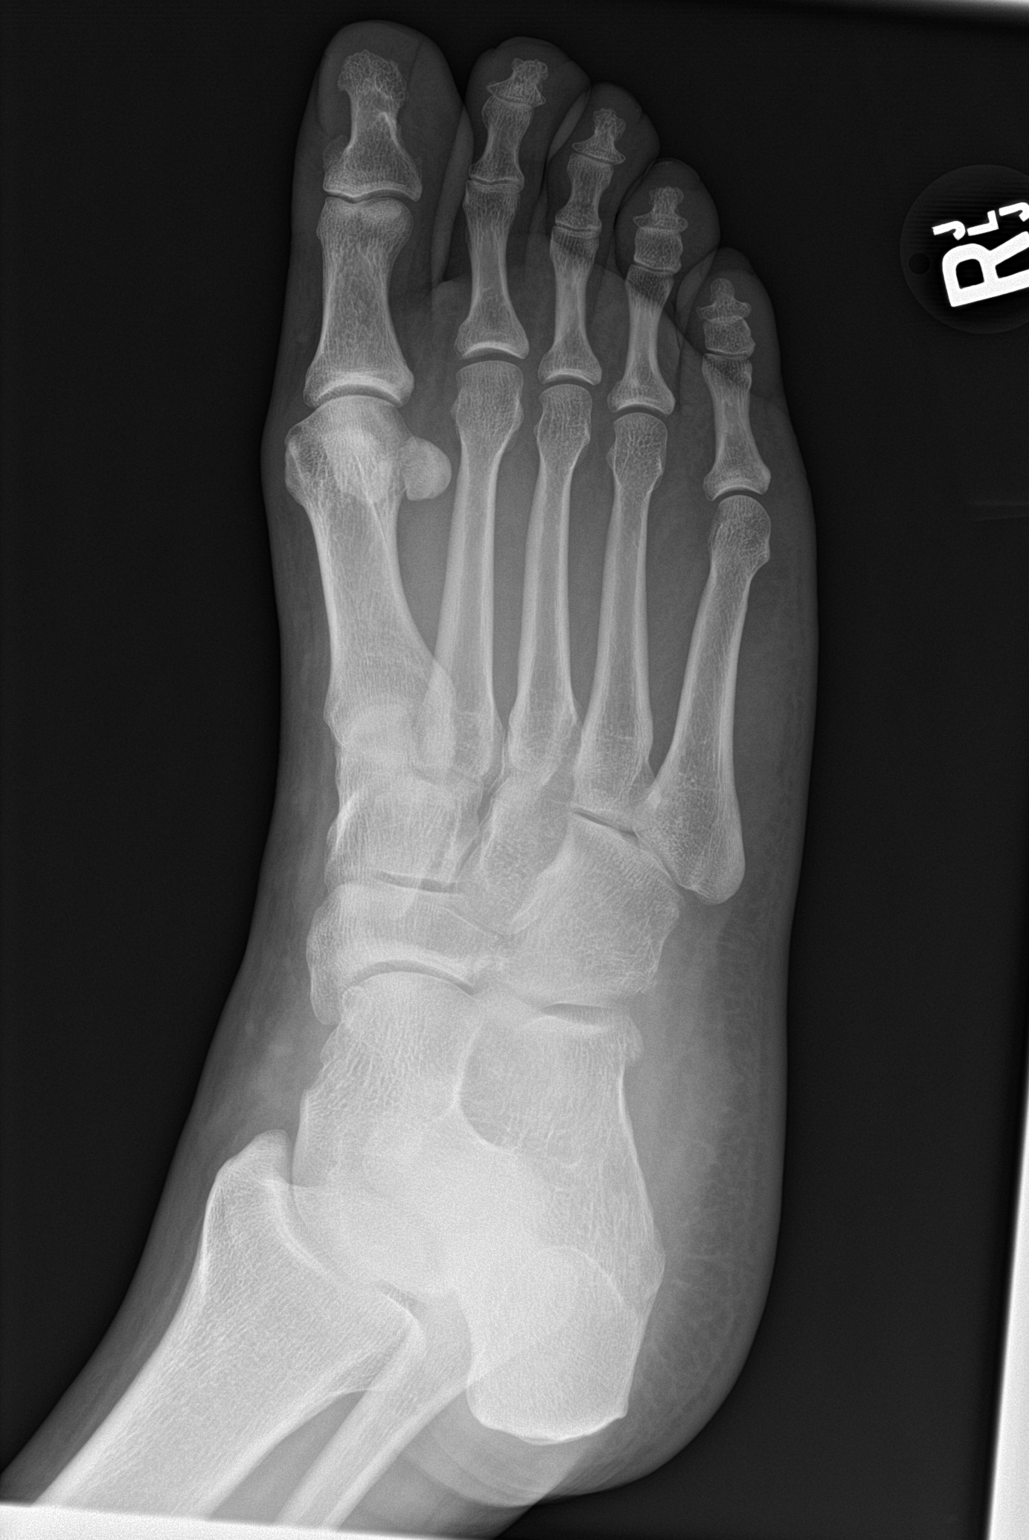

[foot lat]
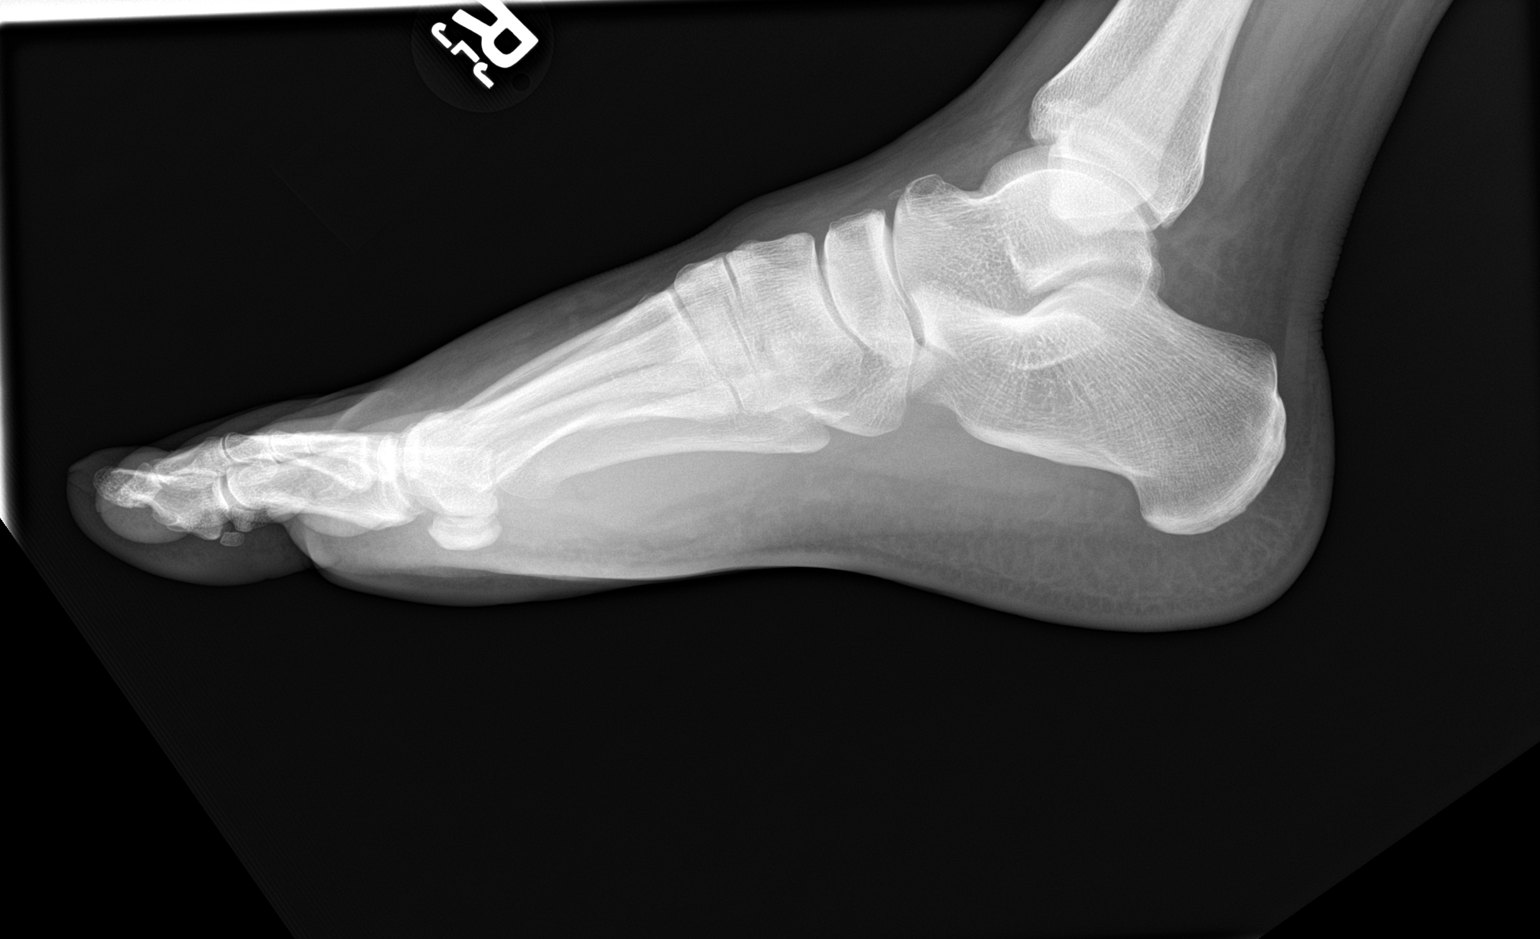

[3 of 3 positions shown; findings below may reference images not displayed]

FINDINGS: RIGHT foot: Subcentimeter bone fragments lateral mid to hindfoot,
partially corticated. No dislocation. There is no evidence of
arthropathy or other focal bone abnormality. Soft tissues are
unremarkable.

RIGHT ankle: No fracture deformity nor dislocation. The ankle
mortise appears congruent and the tibiofibular syndesmosis intact.
No destructive bony lesions. Soft tissue planes are non-suspicious.
IMPRESSION: 1. Atypical os peroneum versus avulsion fracture. Recommend
correlation with point tenderness.

## 2019-07-06 IMAGING — CR DG CHEST 2V
2 series · 2 of 2 positions shown · non-contrast
Comparison: 06/09/2013, 11/14/2004.

CLINICAL DATA: Acute onset of mid chest pain and shortness of
breath that began last night. Current history of asthma.

EXAM:
CHEST - 2 VIEW

[chest pa]
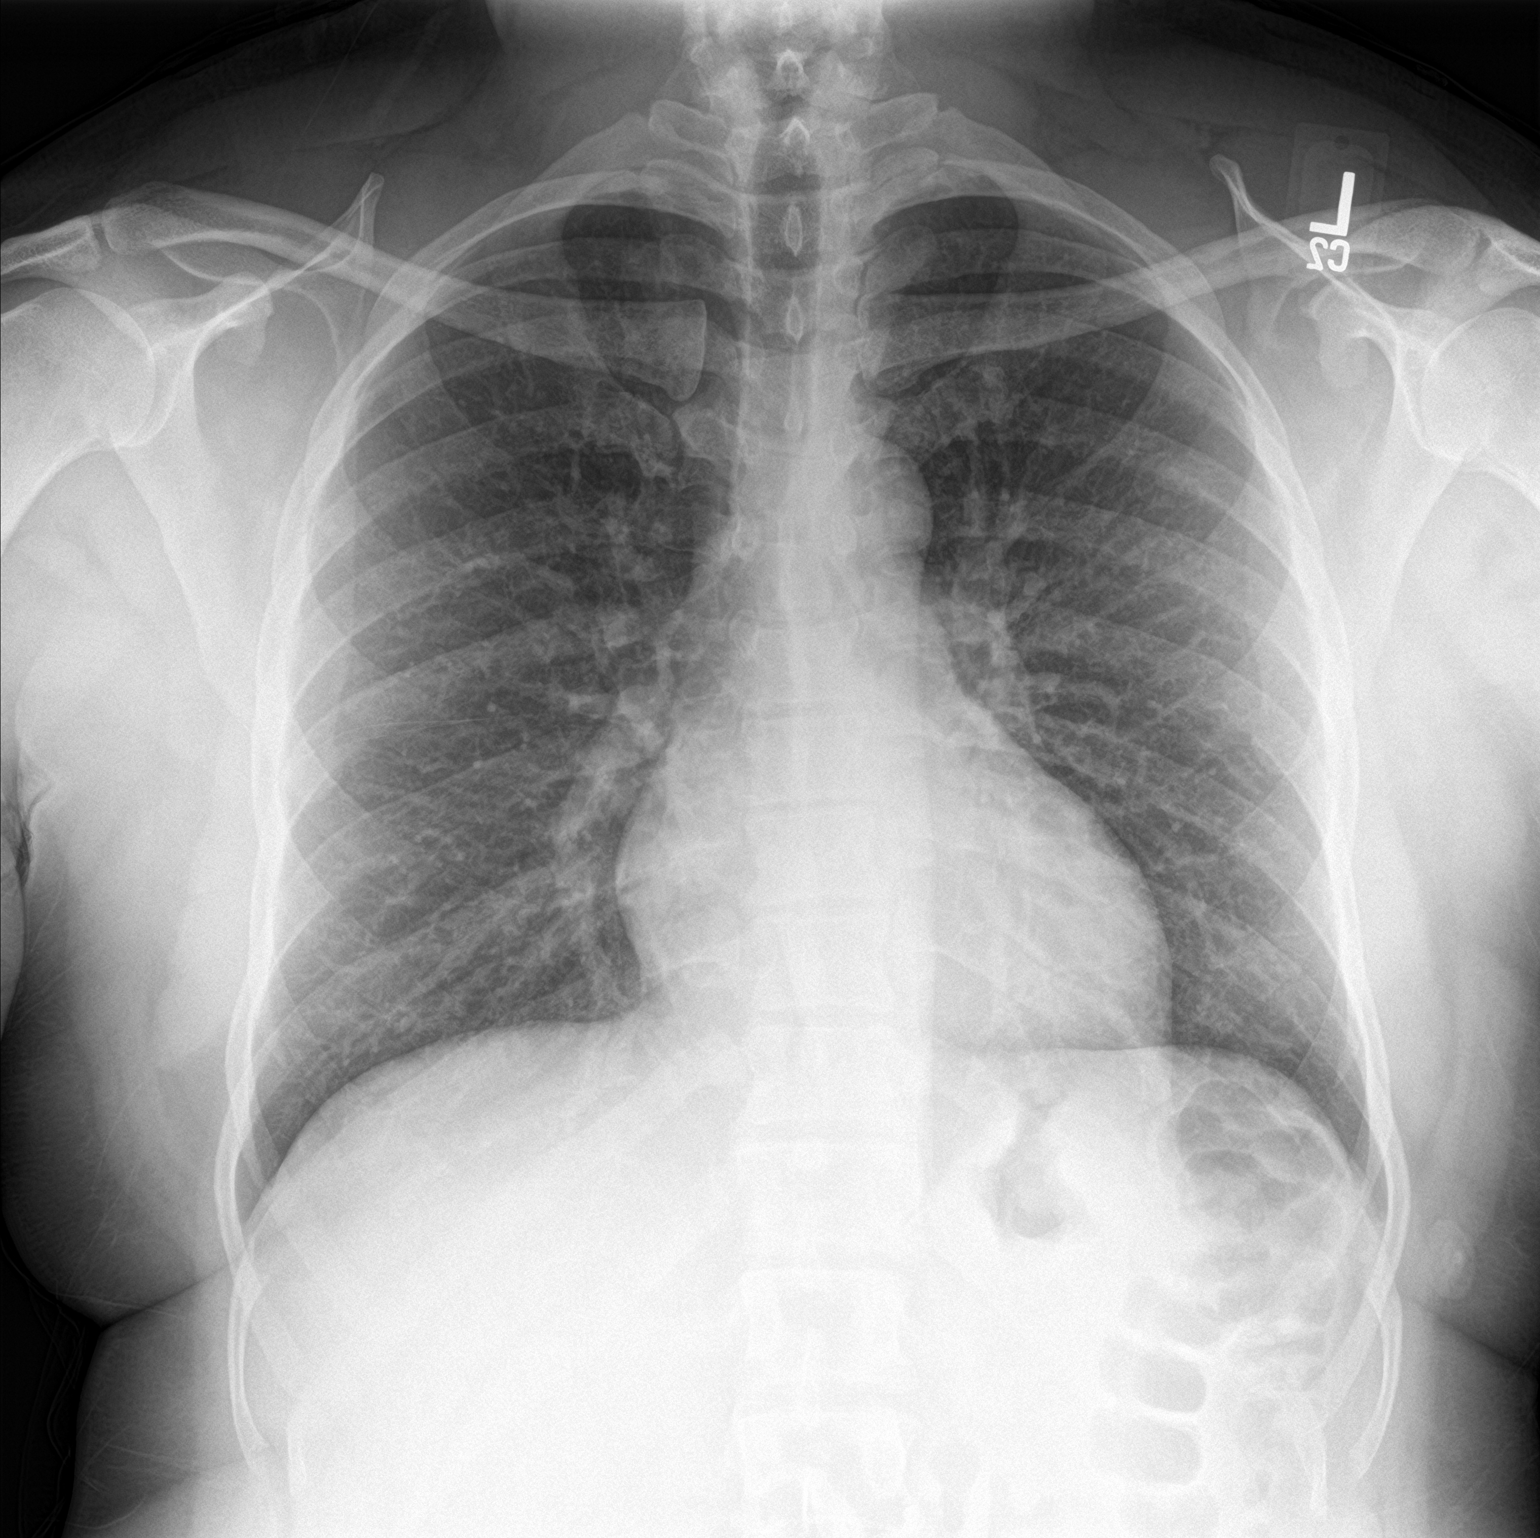

[chest lat]
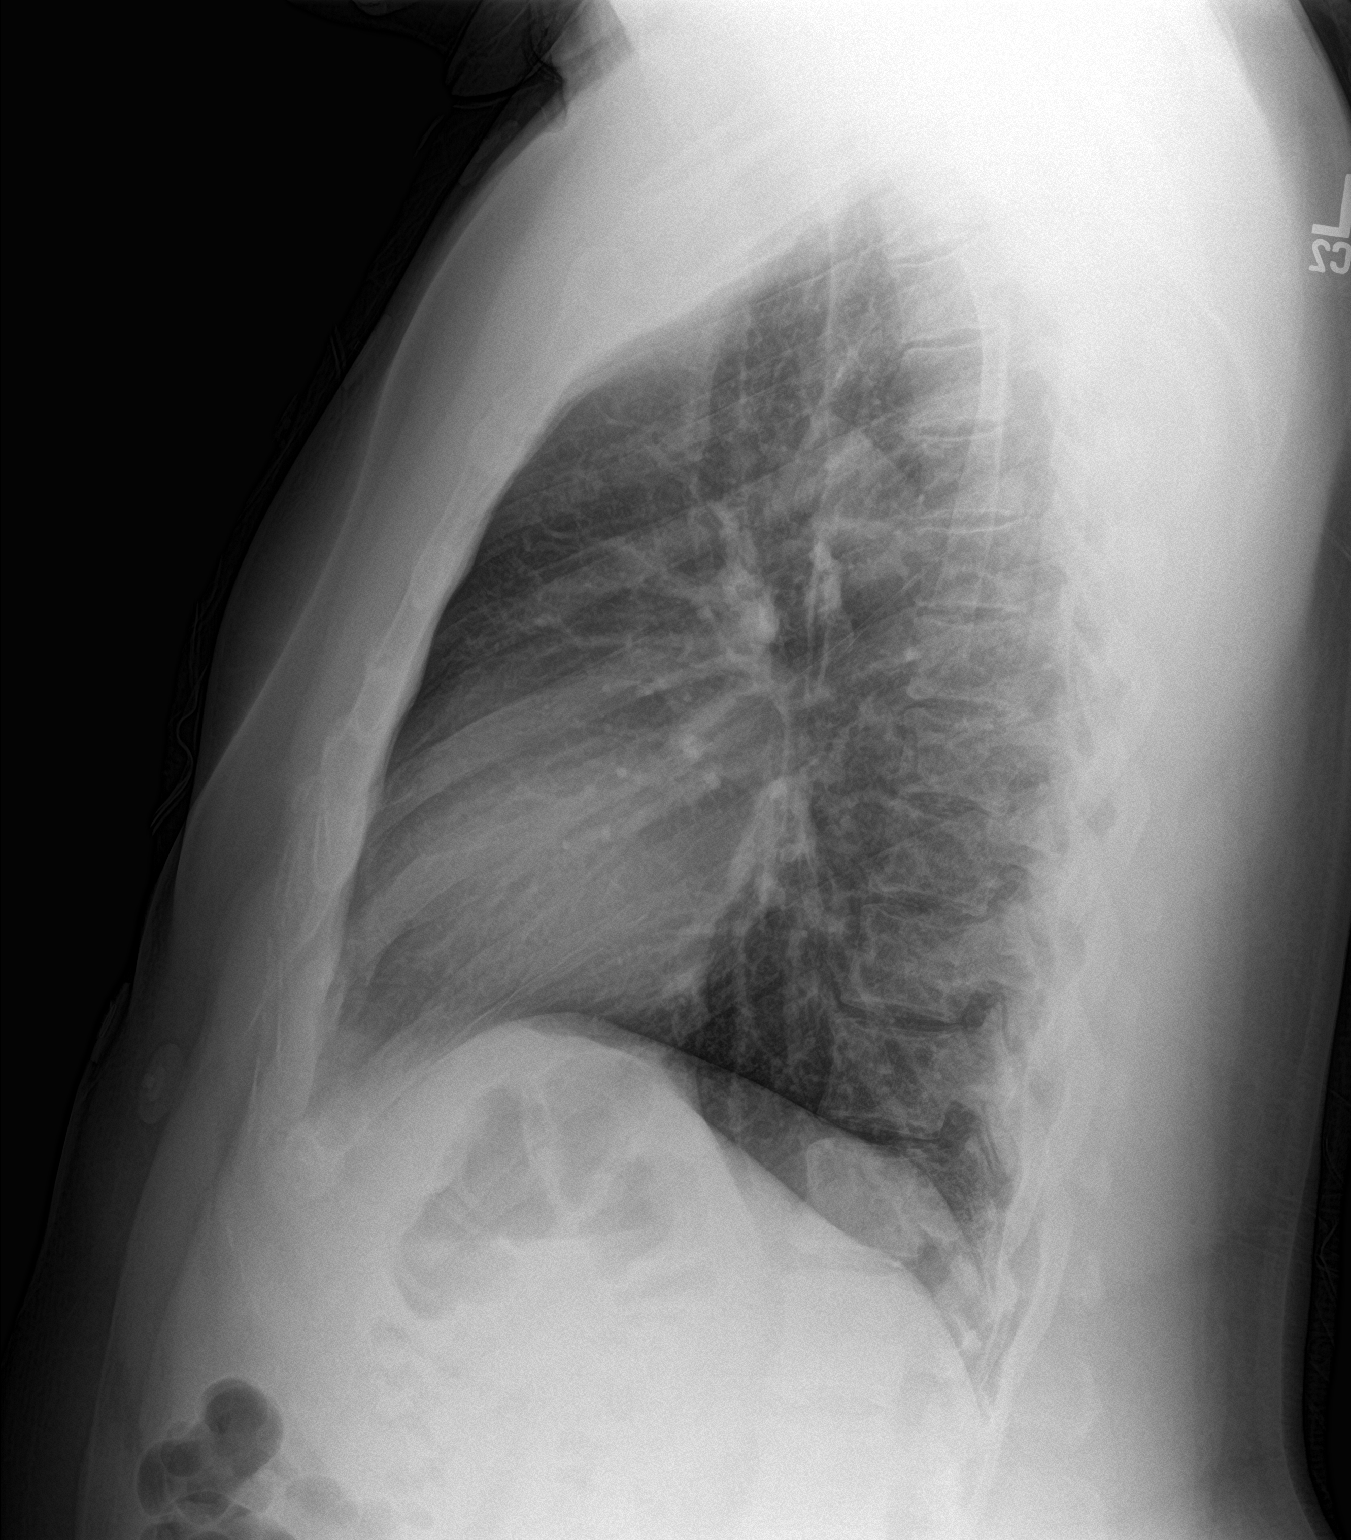

[2 of 2 positions shown; findings below may reference images not displayed]

FINDINGS: Cardiomediastinal silhouette unremarkable and unchanged. Mildly
prominent bronchovascular markings diffusely and mild central
peribronchial thickening, more so than on the prior examinations.
Lungs otherwise clear. No localized airspace consolidation. No
pleural effusions. No pneumothorax. Normal pulmonary vascularity.
Visualized bony thorax intact.
IMPRESSION: Mild changes of acute bronchitis and/or asthma without focal
airspace pneumonia.

## 2019-07-14 DIAGNOSIS — Z0000 Encounter for general adult medical examination without abnormal findings: Secondary | ICD-10-CM | POA: Diagnosis not present

## 2019-07-14 DIAGNOSIS — Z13220 Encounter for screening for lipoid disorders: Secondary | ICD-10-CM | POA: Diagnosis not present

## 2019-07-23 ENCOUNTER — Ambulatory Visit: Payer: BC Managed Care – PPO | Attending: Internal Medicine

## 2019-07-23 DIAGNOSIS — Z23 Encounter for immunization: Secondary | ICD-10-CM | POA: Insufficient documentation

## 2019-07-23 NOTE — Progress Notes (Signed)
   Covid-19 Vaccination Clinic  Name:  Luis Harvey    MRN: [id number] DOB: May 20, 1980  07/23/2019  [name] was observed post Covid-19 immunization for 15 minutes without incident. He was provided with Vaccine Information Sheet and instruction to access the V-Safe system.   [name] was instructed to call 911 with any severe reactions post vaccine: Marland Kitchen Difficulty breathing  . Swelling of face and throat  . A fast heartbeat  . A bad rash all over body  . Dizziness and weakness   Immunizations Administered    Name Date Dose VIS Date Route   Pfizer COVID-19 Vaccine 07/23/2019  9:24 AM 0.3 mL 04/29/2019 Intramuscular   Manufacturer: [employer], [employer]   Lot: [id number]   NDC: [address]-9

## 2019-08-13 ENCOUNTER — Ambulatory Visit: Payer: BC Managed Care – PPO | Attending: Internal Medicine

## 2019-08-13 DIAGNOSIS — Z23 Encounter for immunization: Secondary | ICD-10-CM

## 2019-08-13 NOTE — Progress Notes (Signed)
   Covid-19 Vaccination Clinic  Name:  LORETO LOESCHER    MRN: [id number] DOB: 12/16/1980  08/13/2019  Mr. Speyer was observed post Covid-19 immunization for 15 minutes without incident. He was provided with Vaccine Information Sheet and instruction to access the V-Safe system.   Mr. Corvin was instructed to call 911 with any severe reactions post vaccine: Marland Kitchen Difficulty breathing  . Swelling of face and throat  . A fast heartbeat  . A bad rash all over body  . Dizziness and weakness   Immunizations Administered    Name Date Dose VIS Date Route   Pfizer COVID-19 Vaccine 08/13/2019 10:00 AM 0.3 mL 04/29/2019 Intramuscular   Manufacturer: [employer], [employer]   Lot: [id number]   NDC: [address]-2

## 2020-03-24 DIAGNOSIS — Z23 Encounter for immunization: Secondary | ICD-10-CM | POA: Diagnosis not present

## 2020-12-31 DIAGNOSIS — G4733 Obstructive sleep apnea (adult) (pediatric): Secondary | ICD-10-CM | POA: Diagnosis not present

## 2020-12-31 DIAGNOSIS — Z6833 Body mass index (BMI) 33.0-33.9, adult: Secondary | ICD-10-CM | POA: Diagnosis not present

## 2020-12-31 DIAGNOSIS — E669 Obesity, unspecified: Secondary | ICD-10-CM | POA: Diagnosis not present

## 2021-01-28 ENCOUNTER — Encounter: Payer: Self-pay | Admitting: Family Medicine

## 2021-01-28 ENCOUNTER — Ambulatory Visit: Payer: BC Managed Care – PPO | Admitting: Family Medicine

## 2021-01-28 VITALS — BP 125/83 | HR 73 | Ht 71.0 in | Wt 219.5 lb

## 2021-01-28 DIAGNOSIS — Z9989 Dependence on other enabling machines and devices: Secondary | ICD-10-CM | POA: Diagnosis not present

## 2021-01-28 DIAGNOSIS — G4733 Obstructive sleep apnea (adult) (pediatric): Secondary | ICD-10-CM

## 2021-01-28 NOTE — Patient Instructions (Addendum)
Please continue using your CPAP regularly. While your insurance requires that you use CPAP at least 4 hours each night on 70% of the nights, I recommend, that you not skip any nights and use it throughout the night if you can. Getting used to CPAP and staying with the treatment long term does take time and patience and discipline. Untreated obstructive sleep apnea when it is moderate to severe can have an adverse impact on cardiovascular health and raise her risk for heart disease, arrhythmias, hypertension, congestive heart failure, stroke and diabetes. Untreated obstructive sleep apnea causes sleep disruption, nonrestorative sleep, and sleep deprivation. This can have an impact on your day to day functioning and cause daytime sleepiness and impairment of cognitive function, memory loss, mood disturbance, and problems focussing. Using CPAP regularly can improve these symptoms.  Follow up in 3-4 months for compliance review

## 2021-01-28 NOTE — Progress Notes (Signed)
PATIENT: Luis Harvey DOB: 04/11/1981  REASON FOR VISIT: follow up HISTORY FROM: patient  Chief Complaint  Patient presents with   Obstructive Sleep Apnea    EM Rm 3. Alone. Here for initial CPAP f/u, pt states he has not been using his CPAP in almost a year. Pt is needing supplies.      HISTORY OF PRESENT ILLNESS:  01/28/21 ALL:  Luis Harvey is a 40 y.o. male here today for follow up for OSA on CPAP.  He was started on CPAP in 2019 after HST showed moderate OSA with AHI 19/hr. He was last seen 10/2018 and having difficulty with compliance. He has tried to resume CPAP therapy but unable to obtain new supplies from DME. He does report benefit with using CPAP consistently. He wishes to resume therapy.    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 Marland KitchenHis last CPAP download indicates that he uses machine 28 out of 30 days for compliance of 93%.  He uses machine greater than 4 hours 24 out of 30 days for compliance of 80%.  He reported that he has seen the benefit of using CPAP although he does report daytime sleepiness.  Now his compliance has become very poor, and he reports that he lost his job in March , not longer shift working but stuck at home- he is losing track of time, watches V , falls asleep on the couch, misses to put CPAP on   REVIEW OF SYSTEMS: Out of a complete 14 system review of symptoms, the patient complains only of the following symptoms, daytime sleepiness and all other reviewed systems are negative.  ESS: 17  ALLERGIES: Allergies  Allergen Reactions   Theophyllines Other (See Comments)    childhood allergy    HOME MEDICATIONS: Outpatient Medications Prior to Visit  Medication Sig Dispense Refill   albuterol (PROVENTIL HFA;VENTOLIN HFA) 108 (90 BASE) MCG/ACT inhaler Inhale 1-2 puffs into the lungs every 4 (four) hours as  needed for wheezing or shortness of breath.     fexofenadine (ALLEGRA) 180 MG tablet Take 180 mg by mouth daily.     HYDROcodone-acetaminophen (NORCO/VICODIN) 5-325 MG tablet TK 1 T PO Q 4 TO 6 H PRN     modafinil (PROVIGIL) 200 MG tablet Take 1 tablet (200 mg total) by mouth daily. 30 tablet 5   mometasone (NASONEX) 50 MCG/ACT nasal spray Place 2 sprays into the nose daily. 17 g 0   phentermine (ADIPEX-P) 37.5 MG tablet Take 37.5 mg by mouth daily as needed.     topiramate (TOPAMAX) 50 MG tablet Take 50 mg by mouth 2 (two) times daily.     traMADol (ULTRAM) 50 MG tablet Take 1 tablet (50 mg total) by mouth every 12 (twelve) hours as needed. 30 tablet 0   No facility-administered medications prior to visit.    PAST MEDICAL HISTORY: Past Medical History:  Diagnosis Date   Asthma     PAST SURGICAL HISTORY: History reviewed. No pertinent surgical history.  FAMILY HISTORY: Family History  Problem Relation Age of Onset   Asthma Mother    Heart disease Maternal Grandmother     SOCIAL HISTORY: Social History   Socioeconomic History   Marital status: Married    Spouse name: Not on file   Number of children: Not on file   Years of education: Not on  file   Highest education level: Not on file  Occupational History   Not on file  Tobacco Use   Smoking status: Never   Smokeless tobacco: Never  Substance and Sexual Activity   Alcohol use: No   Drug use: No   Sexual activity: Not on file  Other Topics Concern   Not on file  Social History Narrative   Not on file   Social Determinants of Health   Financial Resource Strain: Not on file  Food Insecurity: Not on file  Transportation Needs: Not on file  Physical Activity: Not on file  Stress: Not on file  Social Connections: Not on file  Intimate Partner Violence: Not on file     PHYSICAL EXAM  Vitals:   01/28/21 1110  BP: 125/83  Pulse: 73  Weight: 219 lb 8 oz (99.6 kg)  Height: 5\' 11"  (1.803 m)   Body mass index  is 30.61 kg/m.  Generalized: Well developed, in no acute distress  Cardiology: normal rate and rhythm, no murmur noted Respiratory: clear to auscultation bilaterally  Neurological examination  Mentation: Alert oriented to time, place, history taking. Follows all commands speech and language fluent Cranial nerve II-XII: Pupils were equal round reactive to light. Extraocular movements were full, visual field were full  Motor: The motor testing reveals 5 over 5 strength of all 4 extremities. Good symmetric motor tone is noted throughout.  Gait and station: Gait is normal.    DIAGNOSTIC DATA (LABS, IMAGING, TESTING) - I reviewed patient records, labs, notes, testing and imaging myself where available.  No flowsheet data found.   Lab Results  Component Value Date   WBC 7.2 06/30/2018   HGB 14.9 06/30/2018   HCT 42.7 06/30/2018   MCV 84.4 06/30/2018   PLT 209 06/30/2018      Component Value Date/Time   NA 139 06/30/2018 1011   K 4.0 06/30/2018 1011   CL 102 06/30/2018 1011   CO2 26 06/30/2018 1011   GLUCOSE 105 (H) 06/30/2018 1011   BUN 10 06/30/2018 1011   CREATININE 1.02 06/30/2018 1011   CALCIUM 9.3 06/30/2018 1011   PROT 7.4 08/05/2013 2323   ALBUMIN 3.8 08/05/2013 2323   AST 29 08/05/2013 2323   ALT 13 08/05/2013 2323   ALKPHOS 55 08/05/2013 2323   BILITOT 0.3 08/05/2013 2323   GFRNONAA >60 06/30/2018 1011   GFRAA >60 06/30/2018 1011   No results found for: CHOL, HDL, LDLCALC, LDLDIRECT, TRIG, CHOLHDL No results found for: 08/29/2018 No results found for: VITAMINB12 No results found for: TSH   ASSESSMENT AND PLAN 40 y.o. year old male  has a past medical history of Asthma. here with     ICD-10-CM   1. OSA on CPAP  G47.33 For home use only DME continuous positive airway pressure (CPAP)   Z99.89         Luis Harvey was previously doing well on CPAP prior to 07/2018. Since, he has struggled to meet daily compliance. He does note benefit of CPAP therapy and  wishes to resume. He has not used CPAP in the past 90 days. I will update supply orders, today. He was encouraged to use CPAP nightly and for greater than 4 hours each night. Risks of untreated sleep apnea review and education materials provided. Healthy lifestyle habits encouraged. He will follow up in 3-4 months, sooner if needed. He verbalizes understanding and agreement with this plan.    Orders Placed This Encounter  Procedures   For  home use only DME continuous positive airway pressure (CPAP)    Supplies    Order Specific Question:   Length of Need    Answer:   Lifetime    Order Specific Question:   Patient has OSA or probable OSA    Answer:   Yes    Order Specific Question:   Is the patient currently using CPAP in the home    Answer:   Yes    Order Specific Question:   Settings    Answer:   Other see comments    Order Specific Question:   CPAP supplies needed    Answer:   Mask, headgear, cushions, filters, heated tubing and water chamber      No orders of the defined types were placed in this encounter.     Shawnie Dapper, FNP-C 01/28/2021, 11:27 AM [facility] [address], [address] [place], [place] [address] [phone]

## 2021-01-29 NOTE — Progress Notes (Signed)
CM sent to [facility] 

## 2021-02-18 DIAGNOSIS — E6609 Other obesity due to excess calories: Secondary | ICD-10-CM | POA: Diagnosis not present

## 2021-02-18 DIAGNOSIS — G4733 Obstructive sleep apnea (adult) (pediatric): Secondary | ICD-10-CM | POA: Diagnosis not present

## 2021-02-18 DIAGNOSIS — Z6834 Body mass index (BMI) 34.0-34.9, adult: Secondary | ICD-10-CM | POA: Diagnosis not present

## 2021-03-06 DIAGNOSIS — G4733 Obstructive sleep apnea (adult) (pediatric): Secondary | ICD-10-CM | POA: Diagnosis not present

## 2021-03-21 DIAGNOSIS — R55 Syncope and collapse: Secondary | ICD-10-CM | POA: Diagnosis not present

## 2021-04-05 DIAGNOSIS — Z79899 Other long term (current) drug therapy: Secondary | ICD-10-CM | POA: Diagnosis not present

## 2021-04-05 DIAGNOSIS — M25561 Pain in right knee: Secondary | ICD-10-CM | POA: Diagnosis not present

## 2021-04-05 DIAGNOSIS — M25562 Pain in left knee: Secondary | ICD-10-CM | POA: Diagnosis not present

## 2021-04-05 DIAGNOSIS — Z888 Allergy status to other drugs, medicaments and biological substances status: Secondary | ICD-10-CM | POA: Diagnosis not present

## 2021-04-05 DIAGNOSIS — M25462 Effusion, left knee: Secondary | ICD-10-CM | POA: Diagnosis not present

## 2021-04-05 DIAGNOSIS — J45909 Unspecified asthma, uncomplicated: Secondary | ICD-10-CM | POA: Diagnosis not present

## 2021-04-06 DIAGNOSIS — M25561 Pain in right knee: Secondary | ICD-10-CM | POA: Diagnosis not present

## 2021-04-06 DIAGNOSIS — G4733 Obstructive sleep apnea (adult) (pediatric): Secondary | ICD-10-CM | POA: Diagnosis not present

## 2021-04-15 DIAGNOSIS — G4733 Obstructive sleep apnea (adult) (pediatric): Secondary | ICD-10-CM | POA: Diagnosis not present

## 2021-04-15 DIAGNOSIS — Z6830 Body mass index (BMI) 30.0-30.9, adult: Secondary | ICD-10-CM | POA: Diagnosis not present

## 2021-04-15 DIAGNOSIS — E669 Obesity, unspecified: Secondary | ICD-10-CM | POA: Diagnosis not present

## 2021-04-19 DIAGNOSIS — R7401 Elevation of levels of liver transaminase levels: Secondary | ICD-10-CM | POA: Diagnosis not present

## 2021-04-19 DIAGNOSIS — R945 Abnormal results of liver function studies: Secondary | ICD-10-CM | POA: Diagnosis not present

## 2021-05-06 DIAGNOSIS — G4733 Obstructive sleep apnea (adult) (pediatric): Secondary | ICD-10-CM | POA: Diagnosis not present

## 2021-05-08 NOTE — Patient Instructions (Incomplete)

## 2021-05-08 NOTE — Progress Notes (Deleted)
PATIENT: Luis Harvey DOB: 1981-02-25  REASON FOR VISIT: follow up HISTORY FROM: patient  No chief complaint on file.    HISTORY OF PRESENT ILLNESS:  05/08/21 ALL:  Luis Harvey returns for follow up for OSa on CPAP. He is doing much better with compliance.     01/28/2021 ALL:  Luis Harvey is a 40 y.o. male here today for follow up for OSA on CPAP.  He was started on CPAP in 2019 after HST showed moderate OSA with AHI 19/hr. He was last seen 10/2018 and having difficulty with compliance. He has tried to resume CPAP therapy but unable to obtain new supplies from DME. He does report benefit with using CPAP consistently. He wishes to resume therapy.    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 Marland KitchenHis last CPAP download indicates that he uses machine 28 out of 30 days for compliance of 93%.  He uses machine greater than 4 hours 24 out of 30 days for compliance of 80%.  He reported that he has seen the benefit of using CPAP although he does report daytime sleepiness.  Now his compliance has become very poor, and he reports that he lost his job in March , not longer shift working but stuck at home- he is losing track of time, watches V , falls asleep on the couch, misses to put CPAP on   REVIEW OF SYSTEMS: Out of a complete 14 system review of symptoms, the patient complains only of the following symptoms, daytime sleepiness and all other reviewed systems are negative.  ESS: 17  ALLERGIES: Allergies  Allergen Reactions   Theophyllines Other (See Comments)    childhood allergy    HOME MEDICATIONS: Outpatient Medications Prior to Visit  Medication Sig Dispense Refill   albuterol (PROVENTIL HFA;VENTOLIN HFA) 108 (90 BASE) MCG/ACT inhaler Inhale 1-2 puffs into the lungs every 4 (four) hours as needed for wheezing or shortness of breath.      fexofenadine (ALLEGRA) 180 MG tablet Take 180 mg by mouth daily.     HYDROcodone-acetaminophen (NORCO/VICODIN) 5-325 MG tablet TK 1 T PO Q 4 TO 6 H PRN     modafinil (PROVIGIL) 200 MG tablet Take 1 tablet (200 mg total) by mouth daily. 30 tablet 5   mometasone (NASONEX) 50 MCG/ACT nasal spray Place 2 sprays into the nose daily. 17 g 0   phentermine (ADIPEX-P) 37.5 MG tablet Take 37.5 mg by mouth daily as needed.     topiramate (TOPAMAX) 50 MG tablet Take 50 mg by mouth 2 (two) times daily.     traMADol (ULTRAM) 50 MG tablet Take 1 tablet (50 mg total) by mouth every 12 (twelve) hours as needed. 30 tablet 0   No facility-administered medications prior to visit.    PAST MEDICAL HISTORY: Past Medical History:  Diagnosis Date   Asthma     PAST SURGICAL HISTORY: No past surgical history on file.  FAMILY HISTORY: Family History  Problem Relation Age of Onset   Asthma Mother    Heart disease Maternal Grandmother     SOCIAL HISTORY: Social History   Socioeconomic History   Marital status: Married    Spouse name: Not on file   Number of children: Not on file   Years of education: Not on file   Highest education level: Not on file  Occupational History  Not on file  Tobacco Use   Smoking status: Never   Smokeless tobacco: Never  Substance and Sexual Activity   Alcohol use: No   Drug use: No   Sexual activity: Not on file  Other Topics Concern   Not on file  Social History Narrative   Not on file   Social Determinants of Health   Financial Resource Strain: Not on file  Food Insecurity: Not on file  Transportation Needs: Not on file  Physical Activity: Not on file  Stress: Not on file  Social Connections: Not on file  Intimate Partner Violence: Not on file     PHYSICAL EXAM  There were no vitals filed for this visit.  There is no height or weight on file to calculate BMI.  Generalized: Well developed, in no acute distress  Cardiology: normal rate and rhythm,  no murmur noted Respiratory: clear to auscultation bilaterally  Neurological examination  Mentation: Alert oriented to time, place, history taking. Follows all commands speech and language fluent Cranial nerve II-XII: Pupils were equal round reactive to light. Extraocular movements were full, visual field were full  Motor: The motor testing reveals 5 over 5 strength of all 4 extremities. Good symmetric motor tone is noted throughout.  Gait and station: Gait is normal.    DIAGNOSTIC DATA (LABS, IMAGING, TESTING) - I reviewed patient records, labs, notes, testing and imaging myself where available.  No flowsheet data found.   Lab Results  Component Value Date   WBC 7.2 06/30/2018   HGB 14.9 06/30/2018   HCT 42.7 06/30/2018   MCV 84.4 06/30/2018   PLT 209 06/30/2018      Component Value Date/Time   NA 139 06/30/2018 1011   K 4.0 06/30/2018 1011   CL 102 06/30/2018 1011   CO2 26 06/30/2018 1011   GLUCOSE 105 (H) 06/30/2018 1011   BUN 10 06/30/2018 1011   CREATININE 1.02 06/30/2018 1011   CALCIUM 9.3 06/30/2018 1011   PROT 7.4 08/05/2013 2323   ALBUMIN 3.8 08/05/2013 2323   AST 29 08/05/2013 2323   ALT 13 08/05/2013 2323   ALKPHOS 55 08/05/2013 2323   BILITOT 0.3 08/05/2013 2323   GFRNONAA >60 06/30/2018 1011   GFRAA >60 06/30/2018 1011   No results found for: CHOL, HDL, LDLCALC, LDLDIRECT, TRIG, CHOLHDL No results found for: STMH9Q No results found for: VITAMINB12 No results found for: TSH   ASSESSMENT AND PLAN 40 y.o. year old male  has a past medical history of Asthma. here with   No diagnosis found.     Luis Harvey is doing well. CPAP compliance report shows significant improvement and optimal usage.  He was encouraged to use CPAP nightly and for greater than 4 hours each night. Risks of untreated sleep apnea review and education materials provided. Healthy lifestyle habits encouraged. He will follow up in 1 year, sooner if needed. He verbalizes  understanding and agreement with this plan.    No orders of the defined types were placed in this encounter.     No orders of the defined types were placed in this encounter.      Shawnie Dapper, FNP-C 05/08/2021, 4:22 PM [facility] [address], [address] [place], [place] [address] [phone]

## 2021-05-09 ENCOUNTER — Encounter: Payer: Self-pay | Admitting: Family Medicine

## 2021-05-09 ENCOUNTER — Ambulatory Visit: Payer: BC Managed Care – PPO | Admitting: Family Medicine

## 2021-05-09 ENCOUNTER — Telehealth: Payer: Self-pay | Admitting: Family Medicine
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
## 2021-06-07 DIAGNOSIS — G4733 Obstructive sleep apnea (adult) (pediatric): Secondary | ICD-10-CM | POA: Diagnosis not present

## 2021-06-17 DIAGNOSIS — G4733 Obstructive sleep apnea (adult) (pediatric): Secondary | ICD-10-CM | POA: Diagnosis not present

## 2021-06-17 DIAGNOSIS — E663 Overweight: Secondary | ICD-10-CM | POA: Diagnosis not present

## 2021-07-08 DIAGNOSIS — G4733 Obstructive sleep apnea (adult) (pediatric): Secondary | ICD-10-CM | POA: Diagnosis not present

## 2021-07-18 DIAGNOSIS — Z131 Encounter for screening for diabetes mellitus: Secondary | ICD-10-CM | POA: Diagnosis not present

## 2021-07-18 DIAGNOSIS — Z0000 Encounter for general adult medical examination without abnormal findings: Secondary | ICD-10-CM | POA: Diagnosis not present

## 2021-07-18 DIAGNOSIS — Z13220 Encounter for screening for lipoid disorders: Secondary | ICD-10-CM | POA: Diagnosis not present

## 2021-08-05 DIAGNOSIS — G4733 Obstructive sleep apnea (adult) (pediatric): Secondary | ICD-10-CM | POA: Diagnosis not present

## 2021-08-23 DIAGNOSIS — S299XXA Unspecified injury of thorax, initial encounter: Secondary | ICD-10-CM | POA: Diagnosis not present

## 2021-08-23 DIAGNOSIS — Y92410 Unspecified street and highway as the place of occurrence of the external cause: Secondary | ICD-10-CM | POA: Diagnosis not present

## 2021-08-23 DIAGNOSIS — S8992XA Unspecified injury of left lower leg, initial encounter: Secondary | ICD-10-CM | POA: Diagnosis not present

## 2021-08-23 DIAGNOSIS — S6991XA Unspecified injury of right wrist, hand and finger(s), initial encounter: Secondary | ICD-10-CM | POA: Diagnosis not present

## 2021-08-23 DIAGNOSIS — R079 Chest pain, unspecified: Secondary | ICD-10-CM | POA: Diagnosis not present

## 2021-08-23 DIAGNOSIS — M549 Dorsalgia, unspecified: Secondary | ICD-10-CM | POA: Diagnosis not present

## 2021-08-23 DIAGNOSIS — M79641 Pain in right hand: Secondary | ICD-10-CM | POA: Diagnosis not present

## 2021-08-23 DIAGNOSIS — S3992XA Unspecified injury of lower back, initial encounter: Secondary | ICD-10-CM | POA: Diagnosis not present

## 2021-08-23 DIAGNOSIS — M79642 Pain in left hand: Secondary | ICD-10-CM | POA: Diagnosis not present

## 2021-08-23 DIAGNOSIS — S6992XA Unspecified injury of left wrist, hand and finger(s), initial encounter: Secondary | ICD-10-CM | POA: Diagnosis not present

## 2021-08-23 DIAGNOSIS — M25561 Pain in right knee: Secondary | ICD-10-CM | POA: Diagnosis not present

## 2021-08-23 DIAGNOSIS — M25562 Pain in left knee: Secondary | ICD-10-CM | POA: Diagnosis not present

## 2021-08-28 DIAGNOSIS — M25511 Pain in right shoulder: Secondary | ICD-10-CM | POA: Diagnosis not present

## 2021-08-28 DIAGNOSIS — M79641 Pain in right hand: Secondary | ICD-10-CM | POA: Diagnosis not present

## 2021-09-06 DIAGNOSIS — G4733 Obstructive sleep apnea (adult) (pediatric): Secondary | ICD-10-CM | POA: Diagnosis not present

## 2021-09-10 ENCOUNTER — Ambulatory Visit: Payer: BC Managed Care – PPO | Admitting: Surgical

## 2021-09-10 NOTE — Patient Instructions (Addendum)

## 2021-09-10 NOTE — Progress Notes (Signed)
? ? ?PATIENT: Luis Harvey ?DOB: 08-15-1980 ? ?REASON FOR VISIT: follow up ?HISTORY FROM: patient ? ?Chief Complaint  ?Patient presents with  ? Obstructive Sleep Apnea  ?  Rm 1, alone. Here for CPAP f/u. Pt reports doing well on CPAP. No concerns today.   ?  ? ?HISTORY OF PRESENT ILLNESS: ? ?09/11/21 ALL:  ?Luis Harvey returns for follow up for OSA on CPAP. He was last seen 01/2021 and having difficulty with compliance. He had not used CPAP recently but endorsed feeling better on therapy. Supply orders updated and he was advised to follow up in 3-4 months. He is doing well. He is using CPAP most every night for about 6 hours. He denies concerns with machine or supplies. He is feeling better rested and more energized. ESS now 4, previously 17.  ? ? ? ?01/28/2021 ALL:  ?Luis Harvey is a 41 y.o. male here today for follow up for OSA on CPAP.  He was started on CPAP in 2019 after HST showed moderate OSA with AHI 19/hr. He was last seen 10/2018 and having difficulty with compliance. He has tried to resume CPAP therapy but unable to obtain new supplies from DME. He does report benefit with using CPAP consistently. He wishes to resume therapy.  ? ? ?HISTORY: (copied from Dr Dohmeier's previous note) ? 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 Marland KitchenHis last CPAP download indicates that he uses machine 28 out of 30 days for compliance of 93%.  He uses machine greater than 4 hours 24 out of 30 days for compliance of 80%.  He reported that he has seen the benefit of using CPAP although he does report daytime sleepiness.  Now his compliance has become very poor, and he reports that he lost his job in March , not longer shift working but stuck at home- he is losing track of time, watches V , falls asleep on the couch, misses to put CPAP on ? ? ?REVIEW OF SYSTEMS: Out of a complete 14 system review of symptoms, the patient complains only of the  following symptoms, daytime sleepiness and all other reviewed systems are negative. ? ?ESS: 4/24, previously 17/24 ? ?ALLERGIES: ?Allergies  ?Allergen Reactions  ? Theophyllines Other (See Comments)  ?  childhood allergy  ? ? ?HOME MEDICATIONS: ?Outpatient Medications Prior to Visit  ?Medication Sig Dispense Refill  ? albuterol (PROVENTIL HFA;VENTOLIN HFA) 108 (90 BASE) MCG/ACT inhaler Inhale 1-2 puffs into the lungs every 4 (four) hours as needed for wheezing or shortness of breath.    ? fexofenadine (ALLEGRA) 180 MG tablet Take 180 mg by mouth daily.    ? HYDROcodone-acetaminophen (NORCO/VICODIN) 5-325 MG tablet TK 1 T PO Q 4 TO 6 H PRN    ? mometasone (NASONEX) 50 MCG/ACT nasal spray Place 2 sprays into the nose daily. 17 g 0  ? topiramate (TOPAMAX) 50 MG tablet Take 50 mg by mouth 2 (two) times daily.    ? modafinil (PROVIGIL) 200 MG tablet Take 1 tablet (200 mg total) by mouth daily. 30 tablet 5  ? phentermine (ADIPEX-P) 37.5 MG tablet Take 37.5 mg by mouth daily as needed.    ? traMADol (ULTRAM) 50 MG tablet Take 1 tablet (50 mg total) by mouth every 12 (twelve) hours as needed. 30 tablet 0  ? ?No facility-administered medications prior to visit.  ? ? ?PAST MEDICAL HISTORY: ?Past Medical History:  ?Diagnosis Date  ?  Asthma   ? ? ?PAST SURGICAL HISTORY: ?History reviewed. No pertinent surgical history. ? ?FAMILY HISTORY: ?Family History  ?Problem Relation Age of Onset  ? Asthma Mother   ? Heart disease Maternal Grandmother   ? ? ?SOCIAL HISTORY: ?Social History  ? ?Socioeconomic History  ? Marital status: Married  ?  Spouse name: Not on file  ? Number of children: Not on file  ? Years of education: Not on file  ? Highest education level: Not on file  ?Occupational History  ? Not on file  ?Tobacco Use  ? Smoking status: Never  ? Smokeless tobacco: Never  ?Substance and Sexual Activity  ? Alcohol use: No  ? Drug use: No  ? Sexual activity: Not on file  ?Other Topics Concern  ? Not on file  ?Social History  Narrative  ? Not on file  ? ?Social Determinants of Health  ? ?Financial Resource Strain: Not on file  ?Food Insecurity: Not on file  ?Transportation Needs: Not on file  ?Physical Activity: Not on file  ?Stress: Not on file  ?Social Connections: Not on file  ?Intimate Partner Violence: Not on file  ? ? ? ?PHYSICAL EXAM ? ?Vitals:  ? 09/11/21 0714  ?BP: 135/87  ?Pulse: 69  ?Weight: 215 lb 8 oz (97.8 kg)  ?Height: 5\' 11"  (1.803 m)  ? ? ?Body mass index is 30.06 kg/m?. ? ?Generalized: Well developed, in no acute distress  ?Cardiology: normal rate and rhythm, no murmur noted ?Respiratory: clear to auscultation bilaterally  ?Neurological examination  ?Mentation: Alert oriented to time, place, history taking. Follows all commands speech and language fluent ?Cranial nerve II-XII: Pupils were equal round reactive to light. Extraocular movements were full, visual field were full  ?Motor: The motor testing reveals 5 over 5 strength of all 4 extremities. Good symmetric motor tone is noted throughout.  ?Gait and station: Gait is normal.  ? ? ?DIAGNOSTIC DATA (LABS, IMAGING, TESTING) ?- I reviewed patient records, labs, notes, testing and imaging myself where available. ? ?   ? View : No data to display.  ?  ?  ?  ?  ? ?Lab Results  ?Component Value Date  ? WBC 7.2 06/30/2018  ? HGB 14.9 06/30/2018  ? HCT 42.7 06/30/2018  ? MCV 84.4 06/30/2018  ? PLT 209 06/30/2018  ? ?   ?Component Value Date/Time  ? NA 139 06/30/2018 1011  ? K 4.0 06/30/2018 1011  ? CL 102 06/30/2018 1011  ? CO2 26 06/30/2018 1011  ? GLUCOSE 105 (H) 06/30/2018 1011  ? BUN 10 06/30/2018 1011  ? CREATININE 1.02 06/30/2018 1011  ? CALCIUM 9.3 06/30/2018 1011  ? PROT 7.4 08/05/2013 2323  ? ALBUMIN 3.8 08/05/2013 2323  ? AST 29 08/05/2013 2323  ? ALT 13 08/05/2013 2323  ? ALKPHOS 55 08/05/2013 2323  ? BILITOT 0.3 08/05/2013 2323  ? GFRNONAA >60 06/30/2018 1011  ? GFRAA >60 06/30/2018 1011  ? ?No results found for: CHOL, HDL, LDLCALC, LDLDIRECT, TRIG, CHOLHDL ?No  results found for: HGBA1C ?No results found for: VITAMINB12 ?No results found for: TSH ? ? ?ASSESSMENT AND PLAN ?41 y.o. year old male  has a past medical history of Asthma. here with  ? ?  ICD-10-CM   ?1. OSA on CPAP  G47.33 For home use only DME continuous positive airway pressure (CPAP)  ? Z99.89   ?  ? ? ? ?Luis Harvey is doing well on CPAP therapy. Compliance report showed excellent daily and acceptable  4 hour usage. He was encouraged to use CPAP nightly and for greater than 4 hours each night. Risks of untreated sleep apnea review and education materials provided. We will update supply orders. Healthy lifestyle habits encouraged. He will follow up in 1 year, sooner if needed. He verbalizes understanding and agreement with this plan.  ? ? ?Orders Placed This Encounter  ?Procedures  ? For home use only DME continuous positive airway pressure (CPAP)  ?  Supplies  ?  Order Specific Question:   Length of Need  ?  Answer:   Lifetime  ?  Order Specific Question:   Patient has OSA or probable OSA  ?  Answer:   Yes  ?  Order Specific Question:   Is the patient currently using CPAP in the home  ?  Answer:   Yes  ?  Order Specific Question:   Settings  ?  Answer:   Other see comments  ?  Order Specific Question:   CPAP supplies needed  ?  Answer:   Mask, headgear, cushions, filters, heated tubing and water chamber  ? ?  ? ?No orders of the defined types were placed in this encounter. ? ?  ? ? ?Shawnie Dapper, FNP-C 09/11/2021, 7:30 AM ?[facility] ?[address], [address] ?[place], [place] [address] ?[phone] ? ? ?

## 2021-09-11 ENCOUNTER — Ambulatory Visit: Payer: BC Managed Care – PPO | Admitting: Family Medicine

## 2021-09-11 ENCOUNTER — Encounter: Payer: Self-pay | Admitting: Family Medicine

## 2021-09-11 VITALS — BP 135/87 | HR 69 | Ht 71.0 in | Wt 215.5 lb

## 2021-09-11 DIAGNOSIS — Z9989 Dependence on other enabling machines and devices: Secondary | ICD-10-CM

## 2021-09-11 DIAGNOSIS — G4733 Obstructive sleep apnea (adult) (pediatric): Secondary | ICD-10-CM

## 2021-09-11 NOTE — Progress Notes (Signed)
CM sent to [facility] for new orders.  °

## 2021-09-13 ENCOUNTER — Ambulatory Visit (INDEPENDENT_AMBULATORY_CARE_PROVIDER_SITE_OTHER): Payer: BC Managed Care – PPO

## 2021-09-13 ENCOUNTER — Ambulatory Visit: Payer: BC Managed Care – PPO | Admitting: Surgical

## 2021-09-13 ENCOUNTER — Encounter: Payer: Self-pay | Admitting: Surgical

## 2021-09-13 DIAGNOSIS — M25531 Pain in right wrist: Secondary | ICD-10-CM

## 2021-09-13 DIAGNOSIS — R0781 Pleurodynia: Secondary | ICD-10-CM | POA: Diagnosis not present

## 2021-09-13 DIAGNOSIS — M79641 Pain in right hand: Secondary | ICD-10-CM

## 2021-09-13 DIAGNOSIS — M25511 Pain in right shoulder: Secondary | ICD-10-CM | POA: Diagnosis not present
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
## 2021-09-24 ENCOUNTER — Encounter: Payer: Self-pay | Admitting: *Deleted

## 2021-09-30 DIAGNOSIS — E669 Obesity, unspecified: Secondary | ICD-10-CM | POA: Diagnosis not present

## 2021-09-30 DIAGNOSIS — Z6830 Body mass index (BMI) 30.0-30.9, adult: Secondary | ICD-10-CM | POA: Diagnosis not present

## 2021-10-06 DIAGNOSIS — G4733 Obstructive sleep apnea (adult) (pediatric): Secondary | ICD-10-CM | POA: Diagnosis not present

## 2021-11-06 DIAGNOSIS — G4733 Obstructive sleep apnea (adult) (pediatric): Secondary | ICD-10-CM | POA: Diagnosis not present

## 2021-12-03 DIAGNOSIS — G4733 Obstructive sleep apnea (adult) (pediatric): Secondary | ICD-10-CM | POA: Diagnosis not present

## 2021-12-03 DIAGNOSIS — R030 Elevated blood-pressure reading, without diagnosis of hypertension: Secondary | ICD-10-CM | POA: Diagnosis not present

## 2021-12-03 DIAGNOSIS — E663 Overweight: Secondary | ICD-10-CM | POA: Diagnosis not present

## 2021-12-28 DIAGNOSIS — Z760 Encounter for issue of repeat prescription: Secondary | ICD-10-CM | POA: Diagnosis not present

## 2021-12-28 DIAGNOSIS — R059 Cough, unspecified: Secondary | ICD-10-CM | POA: Diagnosis not present

## 2021-12-28 DIAGNOSIS — J45909 Unspecified asthma, uncomplicated: Secondary | ICD-10-CM | POA: Diagnosis not present

## 2022-01-07 DIAGNOSIS — J45909 Unspecified asthma, uncomplicated: Secondary | ICD-10-CM | POA: Diagnosis not present

## 2022-01-07 DIAGNOSIS — G4733 Obstructive sleep apnea (adult) (pediatric): Secondary | ICD-10-CM | POA: Diagnosis not present

## 2022-01-29 DIAGNOSIS — G4733 Obstructive sleep apnea (adult) (pediatric): Secondary | ICD-10-CM | POA: Diagnosis not present

## 2022-01-29 DIAGNOSIS — E669 Obesity, unspecified: Secondary | ICD-10-CM | POA: Diagnosis not present

## 2022-01-29 DIAGNOSIS — Z6830 Body mass index (BMI) 30.0-30.9, adult: Secondary | ICD-10-CM | POA: Diagnosis not present

## 2022-01-30 DIAGNOSIS — Z6830 Body mass index (BMI) 30.0-30.9, adult: Secondary | ICD-10-CM | POA: Diagnosis not present

## 2022-01-30 DIAGNOSIS — G4733 Obstructive sleep apnea (adult) (pediatric): Secondary | ICD-10-CM | POA: Diagnosis not present

## 2022-01-30 DIAGNOSIS — J45909 Unspecified asthma, uncomplicated: Secondary | ICD-10-CM | POA: Diagnosis not present

## 2022-02-25 DIAGNOSIS — G4733 Obstructive sleep apnea (adult) (pediatric): Secondary | ICD-10-CM | POA: Diagnosis not present

## 2022-03-07 DIAGNOSIS — R400 Somnolence: Secondary | ICD-10-CM | POA: Diagnosis not present

## 2022-03-07 DIAGNOSIS — G4733 Obstructive sleep apnea (adult) (pediatric): Secondary | ICD-10-CM | POA: Diagnosis not present

## 2022-03-12 DIAGNOSIS — E669 Obesity, unspecified: Secondary | ICD-10-CM | POA: Diagnosis not present

## 2022-03-12 DIAGNOSIS — G4733 Obstructive sleep apnea (adult) (pediatric): Secondary | ICD-10-CM | POA: Diagnosis not present

## 2022-04-07 DIAGNOSIS — R400 Somnolence: Secondary | ICD-10-CM | POA: Diagnosis not present

## 2022-04-07 DIAGNOSIS — G4733 Obstructive sleep apnea (adult) (pediatric): Secondary | ICD-10-CM | POA: Diagnosis not present

## 2022-04-23 DIAGNOSIS — E669 Obesity, unspecified: Secondary | ICD-10-CM | POA: Diagnosis not present

## 2022-04-23 DIAGNOSIS — G4733 Obstructive sleep apnea (adult) (pediatric): Secondary | ICD-10-CM | POA: Diagnosis not present

## 2022-05-07 DIAGNOSIS — G4733 Obstructive sleep apnea (adult) (pediatric): Secondary | ICD-10-CM | POA: Diagnosis not present

## 2022-05-07 DIAGNOSIS — R400 Somnolence: Secondary | ICD-10-CM | POA: Diagnosis not present

## 2022-09-16 ENCOUNTER — Telehealth: Payer: BC Managed Care – PPO | Admitting: Family Medicine
